# Patient Record
Sex: Male | Born: 1995 | Race: Black or African American | Hispanic: No | Marital: Single | Smoking: Never smoker
Health system: Southern US, Community
[De-identification: ages and names within clinical notes are randomized; demographics above are authoritative.]

## PROBLEM LIST (undated history)

## (undated) DIAGNOSIS — R001 Bradycardia, unspecified: Secondary | ICD-10-CM

## (undated) DIAGNOSIS — T7840XA Allergy, unspecified, initial encounter: Secondary | ICD-10-CM

## (undated) DIAGNOSIS — G43909 Migraine, unspecified, not intractable, without status migrainosus: Secondary | ICD-10-CM

## (undated) HISTORY — DX: Migraine, unspecified, not intractable, without status migrainosus: G43.909

## (undated) HISTORY — DX: Bradycardia, unspecified: R00.1

## (undated) HISTORY — DX: Allergy, unspecified, initial encounter: T78.40XA

---
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
## 2015-06-05 ENCOUNTER — Ambulatory Visit: Payer: Self-pay | Admitting: Family Medicine

## 2015-06-06 ENCOUNTER — Ambulatory Visit: Payer: Medicaid Other | Admitting: Family Medicine

## 2015-12-23 IMAGING — CR RIGHT FOOT COMPLETE - 3+ VIEW
1 series · 3 of 3 positions shown · non-contrast
Comparison: 11/16/2010, ankle

CLINICAL DATA: Pain in the joint, ankle, foot.

EXAM:
RIGHT FOOT COMPLETE - 3+ VIEW

[Series 1: kdxr foot rt complete w/obliques · 0.14mm/px · 3 of 3 slices shown]
[im 1/3]
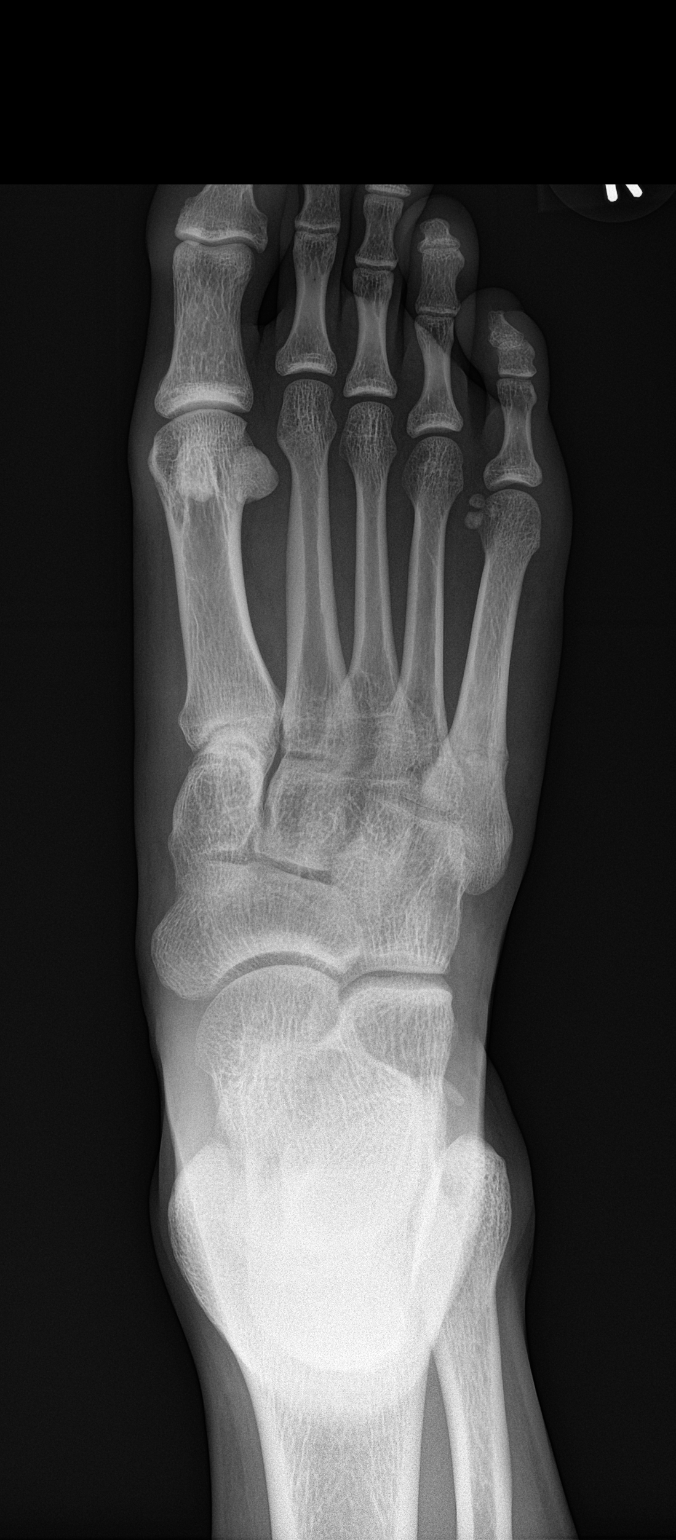
[im 2/3]
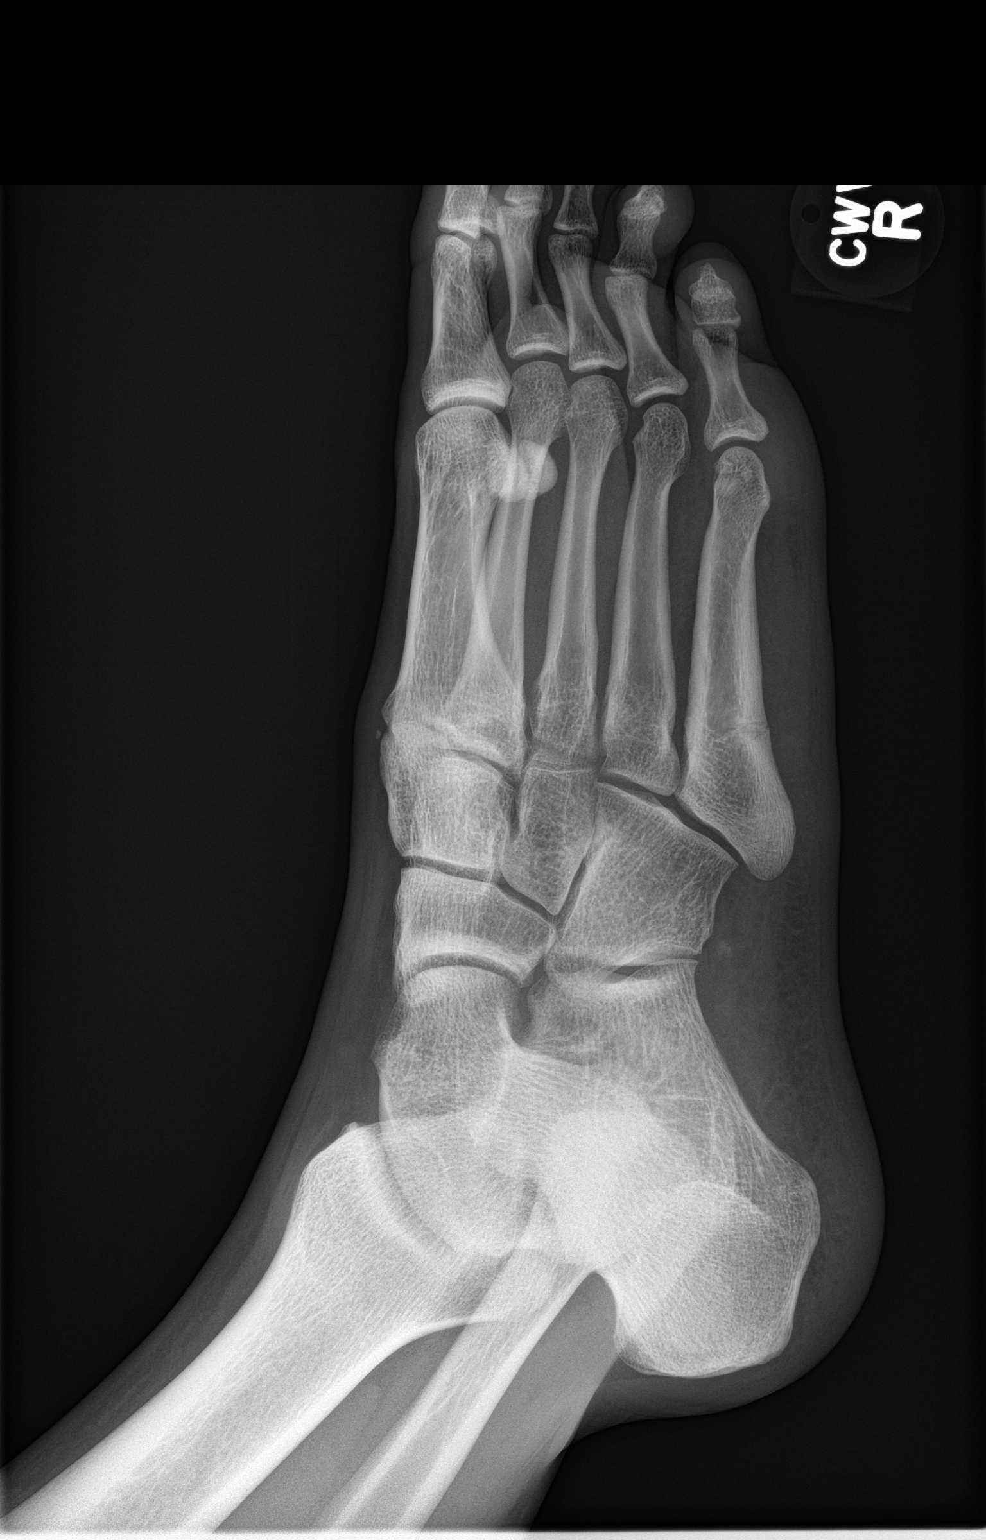
[im 3/3]
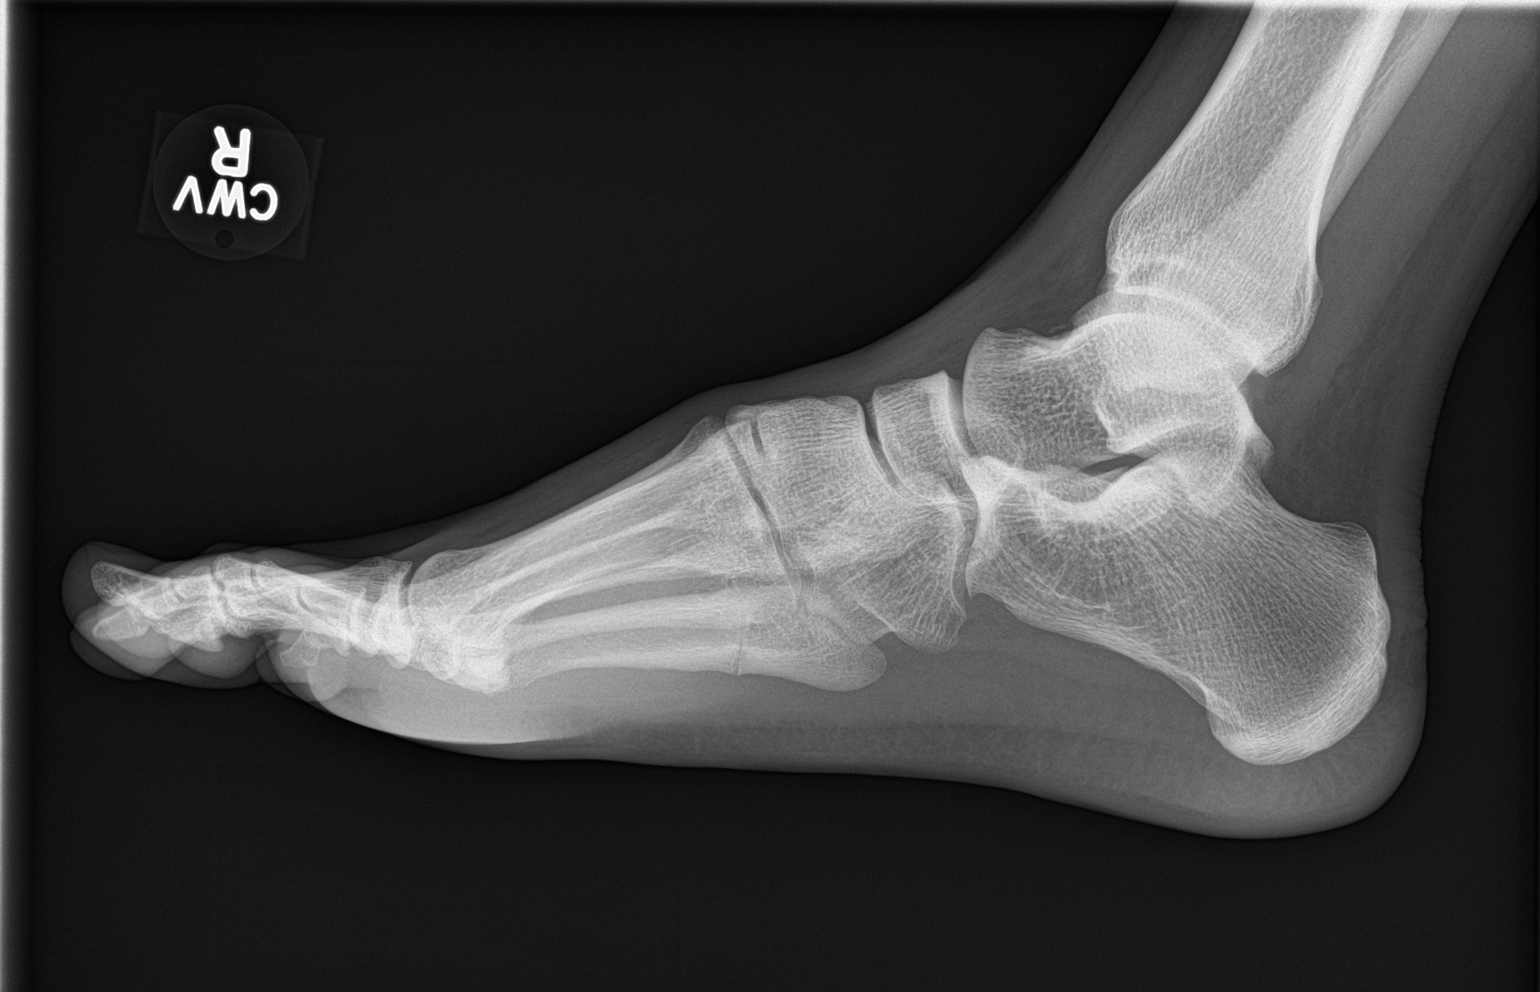

[3 of 3 positions shown; findings below may reference images not displayed]

FINDINGS: There is a transverse fracture at the base of the fifth metatarsal
associated with soft tissue swelling. Normal alignment.
IMPRESSION: Acute fracture of the fifth metatarsal.

## 2016-05-01 ENCOUNTER — Ambulatory Visit: Payer: Self-pay | Admitting: *Deleted

## 2016-05-02 NOTE — BH Specialist Note (Signed)
Counselor met with Cody Rubio today as a walk in.  Medical staff indicated that patient's labs were not good and apparently was not taking his medications accordingly.   It was also reported that patient had obtained a STD which indicated that he was having unprotected sex. Patient was oriented times four with good affect and dress. Patient was alert and honest with counselor. Patient shared that he had unprotected sex with his old boyfriend who also had HIV. Patient communicated that he moved to [facility] and felt that it did a lot good for him emotionally.  Patient stated that he wants to get back on track and would like to get treatment accordingly.  Counselor provided support and encouragement to patient. Counselor recommended that patient make an appointment when he checked out today.  Patient agreed and made the appointment.  [name], MA, LPC [facility]

## 2018-01-16 ENCOUNTER — Encounter: Payer: Self-pay | Admitting: Emergency Medicine

## 2018-01-16 ENCOUNTER — Emergency Department
Admission: EM | Admit: 2018-01-16 | Discharge: 2018-01-16 | Disposition: A | Discharge status: Home / Self Care | Payer: Self-pay | Attending: Emergency Medicine | Admitting: Emergency Medicine

## 2018-01-16 ENCOUNTER — Other Ambulatory Visit: Payer: Self-pay

## 2018-01-16 DIAGNOSIS — K047 Periapical abscess without sinus: Secondary | ICD-10-CM | POA: Insufficient documentation

## 2018-01-16 MED ORDER — AMOXICILLIN 500 MG PO CAPS: 500.0000 mg | ORAL_CAPSULE | Freq: Three times a day (TID) | ORAL | 0 refills | Status: DC

## 2018-01-16 NOTE — ED Provider Notes (Signed)
[facility] Emergency Department Provider Note  ____________________________________________  Time seen: Approximately 5:26 PM  I have reviewed the triage vital signs and the nursing notes.   HISTORY  Chief Complaint Dental Pain    HPI Cody Rubio is a 22 y.o. male that presents emergency department for evaluation of lower left dental pain for 5 days and swelling for 1 day.  His bottom wisdom tooth is coming in.  Patient does not remember his last dental visit.  He denies fever, chills, drainage from mouth, nausea, vomiting, abdominal pain.   Past Medical History:  Diagnosis Date  . Allergy   . Bradycardia   . Migraine     There are no active problems to display for this patient.   Past Surgical History:  Procedure Laterality Date  . CIRCUMCISION  2011    Prior to Admission medications   Medication Sig Start Date End Date Taking? Authorizing Provider  amoxicillin (AMOXIL) 500 MG capsule Take 1 capsule (500 mg total) by mouth 3 (three) times daily. 01/16/18   Enid Derry, PA-C    Allergies Patient has no known allergies.  Family History  Problem Relation Age of Onset  . Heart disease Father   . Asthma Brother     Social History Social History   Tobacco Use  . Smoking status: Never Smoker  Substance Use Topics  . Alcohol use: Not on file  . Drug use: Not on file     Review of Systems  Constitutional: No fever/chills. Cardiovascular: No chest pain. Respiratory: No SOB. Gastrointestinal: No abdominal pain. No nausea, no vomiting.  Musculoskeletal: Negative for musculoskeletal pain. Skin: Negative for rash, abrasions, lacerations, ecchymosis. Neurological: Negative for headaches, numbness or tingling   ____________________________________________   PHYSICAL EXAM:  VITAL SIGNS: ED Triage Vitals  Enc Vitals Group     BP 01/16/18 1517 132/86     Pulse Rate 01/16/18 1517 85     Resp 01/16/18 1517 18     Temp 01/16/18  1517 98.6 F (37 C)     Temp Source 01/16/18 1517 Oral     SpO2 01/16/18 1517 97 %     Weight 01/16/18 1518 170 lb (77.1 kg)     Height 01/16/18 1518 6' (1.829 m)     Head Circumference --      Peak Flow --      Pain Score 01/16/18 1518 3     Pain Loc --      Pain Edu? --      Excl. in GC? --      Constitutional: Alert and oriented. Well appearing and in no acute distress. Eyes: Conjunctivae are normal. PERRL. EOMI. Head: Atraumatic. ENT:      Ears:      Nose: No congestion/rhinnorhea.      Mouth/Throat: Mucous membranes are moist.  Tenderness to palpation around bottom left wisdom tooth.  No visible drainage.  Mild swelling to left cheek.  No TMJ pain.  No drainable abscess. Neck: No stridor.  Cardiovascular: Normal rate, regular rhythm.  Good peripheral circulation. Respiratory: Normal respiratory effort without tachypnea or retractions. Lungs CTAB. Good air entry to the bases with no decreased or absent breath sounds. Musculoskeletal: Full range of motion to all extremities. No gross deformities appreciated. Neurologic:  Normal speech and language. No gross focal neurologic deficits are appreciated.  Skin:  Skin is warm, dry and intact. No rash noted.   ____________________________________________   LABS (all labs ordered are listed, but only abnormal  results are displayed)  Labs Reviewed - No data to display ____________________________________________  EKG   ____________________________________________  RADIOLOGY  No results found.  ____________________________________________    PROCEDURES  Procedure(s) performed:    Procedures    Medications - No data to display   ____________________________________________   INITIAL IMPRESSION / ASSESSMENT AND PLAN / ED COURSE  Pertinent labs & imaging results that were available during my care of the patient were reviewed by me and considered in my medical decision making (see chart for details).  Review  of the [place] CSRS was performed in accordance of the [facility] prior to dispensing any controlled drugs.   Patient's diagnosis is consistent with dental infection.  Vital signs and exam are reassuring.  No drainable abscess.  Patient will be discharged home with prescriptions for amoxicillin.  Dental resources were provided.  Patient is to follow up with dentist as directed. Patient is given ED precautions to return to the ED for any worsening or new symptoms.     ____________________________________________  FINAL CLINICAL IMPRESSION(S) / ED DIAGNOSES  Final diagnoses:  Dental infection      NEW MEDICATIONS STARTED DURING THIS VISIT:  ED Discharge Orders        Ordered    amoxicillin (AMOXIL) 500 MG capsule  3 times daily     01/16/18 1741          This chart was dictated using voice recognition software/Dragon. Despite best efforts to proofread, errors can occur which can change the meaning. Any change was purely unintentional.    Enid DerryWagner, Hersey Maclellan, PA-C 01/16/18 1836    Minna AntisPaduchowski, Kevin, MD 01/17/18 [phone]

## 2018-01-16 NOTE — ED Triage Notes (Signed)
Lower L dental pain x 5 days.  

## 2018-01-16 NOTE — Discharge Instructions (Signed)
OPTIONS FOR DENTAL FOLLOW UP CARE ° °[place] Department of Health and Human Services - Local Safety Net Dental Clinics °http://www.ncdhhs.gov/dph/oralhealth/services/safetynetclinics.htm °  °[facility] (336-562-3123) ° °[facility] ([phone]) ° °[facility] ([phone] ext [phone]) ° °[facility] ([phone]) ° °[facility] ([phone]) °This clinic caters to the indigent population and is on a lottery system. °Location: °[facility], [address], [place] °Clinic Hours: °Wednesdays from 6pm - 9pm, patients seen by a lottery system. °For dates, call or go to [email or web address] °Services: °Cleanings, fillings and simple extractions. °Payment Options: °DENTAL WORK IS FREE OF CHARGE. Bring proof of income or support. °Best way to get seen: °Arrive at 5:15 pm - this is a lottery, NOT first come/first serve, so arriving earlier will not increase your chances of being seen. °  °  °[facility] °[phone] °Select option 1 for emergencies °  °Location: °[facility], [address], [place] °Clinic Hours: °No walk-ins accepted - call the day before to schedule an appointment. °Check in times are 9:30 am and 1:30 pm. °Services: °Simple extractions, temporary fillings, pulpectomy/pulp debridement, uncomplicated abscess drainage. °Payment Options: °PAYMENT IS DUE AT THE TIME OF SERVICE.  Fee is usually $100-200, additional surgical procedures (e.g. abscess drainage) may be extra. °Cash, checks, Visa/MasterCard accepted.  Can file Medicaid if patient is covered for dental - patient should call case worker to check. °No discount for [facility] patients. °Best way to get seen: °MUST call the day before and get onto the schedule. Can usually be seen the next 1-2 days. No walk-ins accepted. °  °  °[facility] °[phone] °   °Location: °[facility], [address], [place] °Clinic Hours: °M, W, Th, F 8am or 1:30pm, Tues 9a or 1:30 - first come/first served. °Services: °Simple extractions, temporary fillings, uncomplicated abscess drainage.  You do not need to be an [place] resident. °Payment Options: °PAYMENT IS DUE AT THE TIME OF SERVICE. °Dental insurance, otherwise sliding scale - bring proof of income or support. °Depending on income and treatment needed, cost is usually $50-200. °Best way to get seen: °Arrive early as it is first come/first served. °  °  °[facility] °[phone] °  °Location: °[address] °Clinic Hours: °Mon-Thu 8a-5p °Services: °Most basic dental services including extractions and fillings. °Payment Options: °PAYMENT IS DUE AT THE TIME OF SERVICE. °Sliding scale, up to 50% off - bring proof if income or support. °Medicaid with dental option accepted. °Best way to get seen: °Call to schedule an appointment, can usually be seen within 2 weeks OR they will try to see walk-ins - show up at 8a or 2p (you may have to wait). °  °  °[facility] °[phone] °[place] RESIDENTS ONLY °  °Location: °[facility], [address], [place], [place] [address] °Clinic Hours: By appointment only. °Monday - Thursday 8am-5pm, Friday 8am-12pm °Services: Cleanings, fillings, extractions. °Payment Options: °PAYMENT IS DUE AT THE TIME OF SERVICE. °Cash, Visa or MasterCard. Sliding scale - $30 minimum per service. °Best way to get seen: °Come in to office, complete packet and make an appointment - need proof of income °or support monies for each household member and proof of [place] residence. °Usually takes about a month to get in. °  °  °[facility] °[phone] °  °Location: °[address]   [place] °Clinic Hours: Walk-in Urgent Care Dental Services are offered Monday-Friday  mornings only. °The numbers of emergencies accepted daily is limited to the number of °providers available. °Maximum 15 - Mondays, Wednesdays & Thursdays °Maximum 10 - Tuesdays & Fridays °Services: °You do not need to be a [place] resident to be seen for a dental emergency. °Emergencies are defined as pain, swelling, abnormal bleeding, or dental trauma. Walkins will receive x-rays if needed. °NOTE: Dental cleaning is not an emergency. °Payment Options: °PAYMENT IS DUE AT THE TIME OF SERVICE. °Minimum co-pay is $40.00 for uninsured patients. °Minimum co-pay is $3.00 for Medicaid with dental coverage. °Dental Insurance is accepted and must be presented at time of visit. °Medicare does not cover dental. °Forms of payment: Cash, credit card, checks. °Best way to get seen: °If not previously registered with the clinic, walk-in dental registration begins at 7:15 am and is on a first come/first serve basis. °If previously registered with the clinic, call to make an appointment. °  °  °[facility] °[phone] °[place] RESIDENTS ONLY °  °Location: °[address], [place], [place] °Clinic Hours: °Mon-Thu 10a-2p °Services: Extractions only! °Payment Options: °FREE (donations accepted) - bring proof of income or support °Best way to get seen: °Call and schedule an appointment OR come at 8am on the 1st Monday of every month (except for holidays) when it is first come/first served. °  °  °[facility] °[phone] °  °Location: °[address], [place] °Clinic Hours: °Friday mornings °Services, Payment Options, Best way to get seen: °Call for info °

## 2018-02-23 ENCOUNTER — Emergency Department
Admission: EM | Admit: 2018-02-23 | Discharge: 2018-02-23 | Disposition: A | Discharge status: Home / Self Care | Payer: Self-pay | Attending: Emergency Medicine | Admitting: Emergency Medicine

## 2018-02-23 ENCOUNTER — Other Ambulatory Visit: Payer: Self-pay

## 2018-02-23 ENCOUNTER — Encounter: Payer: Self-pay | Admitting: Emergency Medicine

## 2018-02-23 DIAGNOSIS — K047 Periapical abscess without sinus: Secondary | ICD-10-CM | POA: Insufficient documentation

## 2018-02-23 MED ORDER — AMOXICILLIN 500 MG PO CAPS: 500.0000 mg | ORAL_CAPSULE | Freq: Three times a day (TID) | ORAL | 0 refills | Status: DC

## 2018-02-23 MED ORDER — HYDROCODONE-ACETAMINOPHEN 5-325 MG PO TABS: 1.0000 | ORAL_TABLET | Freq: Four times a day (QID) | ORAL | 0 refills | Status: DC | PRN

## 2018-02-23 NOTE — ED Triage Notes (Signed)
Presents with left sided facial swelling and increased dental pain   States he has a broken tooth on lower

## 2018-02-23 NOTE — ED Provider Notes (Signed)
[facility] Emergency Department Provider Note ____________________________________________  Time seen: Approximately 8:29 AM  I have reviewed the triage vital signs and the nursing notes.   HISTORY  Chief Complaint Dental Pain   HPI Cody Rubio is a 22 y.o. male who presents to the emergency department for evaluation and treatment of dental pain.  Patient states that he has been taking some antibiotics that were given to him by 1 of his friends, but the pain in the facial swelling has continued.  He states that he has an appointment tomorrow with the dentist.  Past Medical History:  Diagnosis Date  . Allergy   . Bradycardia   . Migraine     There are no active problems to display for this patient.   Past Surgical History:  Procedure Laterality Date  . CIRCUMCISION  2011    Prior to Admission medications   Medication Sig Start Date End Date Taking? Authorizing Provider  amoxicillin (AMOXIL) 500 MG capsule Take 1 capsule (500 mg total) by mouth 3 (three) times daily. 02/23/18   Pammie Chirino, Rulon Eisenmenger B, FNP  HYDROcodone-acetaminophen (NORCO) 5-325 MG tablet Take 1 tablet by mouth every 6 (six) hours as needed for moderate pain. 02/23/18   Chinita Pester, FNP    Allergies Patient has no known allergies.  Family History  Problem Relation Age of Onset  . Heart disease Father   . Asthma Brother     Social History Social History   Tobacco Use  . Smoking status: Never Smoker  . Smokeless tobacco: Never Used  Substance Use Topics  . Alcohol use: Not on file  . Drug use: Not on file    Review of Systems Constitutional: Negative for fever ENT: Positive for dental pain Musculoskeletal: Negative for trismus Skin: Positive for facial swelling overlying area of dental pain ____________________________________________   PHYSICAL EXAM:  VITAL SIGNS: ED Triage Vitals [02/23/18 0825]  Enc Vitals Group     BP 131/70     Pulse Rate (!) 51     Resp  18     Temp 98.6 F (37 C)     Temp Source Oral     SpO2 100 %     Weight      Height      Head Circumference      Peak Flow      Pain Score      Pain Loc      Pain Edu?      Excl. in GC?     Constitutional: Alert and oriented. Well appearing and in no acute distress. Eyes: Conjunctiva are clear without discharge or drainage. Mouth/Throat: See periodontal exam Periodontal Exam    Hematological/Lymphatic/Immunilogical: No palpable lymphadenopathy. Respiratory: Respirations even and unlabored. Musculoskeletal: No trismus Neurologic: Awake, alert, oriented x4. Skin: Facial edema overlying the left mandible that does not extend over the edge into the soft tissue of the neck. No erythema. Psychiatric: Affect and behavior are appropriate.  ____________________________________________   LABS (all labs ordered are listed, but only abnormal results are displayed)  Labs Reviewed - No data to display ____________________________________________   RADIOLOGY  Not indicated ____________________________________________   PROCEDURES  Procedure(s) performed: None  Critical Care performed: No ____________________________________________   INITIAL IMPRESSION / ASSESSMENT AND PLAN / ED COURSE  Cody Rubio is a 22 y.o. male who presents to the emergency department for evaluation and treatment of dental pain.  He was given a prescription for amoxicillin and 1 day of Norco.  He was instructed to see the dentist as scheduled tomorrow.  He was instructed to return to the emergency department for symptoms of change or worsen if he is unable to see the dentist sooner.  Pertinent labs & imaging results that were available during my care of the patient were reviewed by me and considered in my medical decision making (see chart for details).  ____________________________________________   FINAL CLINICAL IMPRESSION(S) / ED DIAGNOSES  Final diagnoses:  Dental abscess    Discharge  Medication List as of 02/23/2018  9:54 AM    START taking these medications   Details  HYDROcodone-acetaminophen (NORCO) 5-325 MG tablet Take 1 tablet by mouth every 6 (six) hours as needed for moderate pain., Starting Tue 02/23/2018, Print        If controlled substance prescribed during this visit, 12 month history viewed on the [place] prior to issuing an initial prescription for Schedule II or III opiod.  Note:  This document was prepared using Dragon voice recognition software and may include unintentional dictation errors.    Chinita Pesterriplett, Averil Digman B, FNP 02/23/18 1002    Jene EveryKinner, Robert, MD 02/23/18 1108

## 2019-10-18 ENCOUNTER — Other Ambulatory Visit: Payer: Self-pay

## 2019-10-18 ENCOUNTER — Emergency Department
Admission: EM | Admit: 2019-10-18 | Discharge: 2019-10-18 | Disposition: A | Discharge status: Home / Self Care | Payer: Self-pay | Attending: Emergency Medicine | Admitting: Emergency Medicine

## 2019-10-18 DIAGNOSIS — K0889 Other specified disorders of teeth and supporting structures: Secondary | ICD-10-CM

## 2019-10-18 DIAGNOSIS — K0381 Cracked tooth: Secondary | ICD-10-CM | POA: Insufficient documentation

## 2019-10-18 MED ORDER — AMOXICILLIN 500 MG PO CAPS: 500.0000 mg | ORAL_CAPSULE | Freq: Three times a day (TID) | ORAL | 0 refills | Status: DC

## 2019-10-18 MED ORDER — IBUPROFEN 800 MG PO TABS: 800.0000 mg | ORAL_TABLET | Freq: Three times a day (TID) | ORAL | 0 refills | Status: AC | PRN

## 2019-10-18 NOTE — ED Notes (Signed)
See triage note  Presents with dental pain  States he thinks it is his wisdom tooth    Low grade temp on arrival

## 2019-10-18 NOTE — ED Triage Notes (Signed)
Right lower toothache X 2 days. Pt alert and oriented X4, cooperative, RR even and unlabored, color WNL. Pt in NAD.

## 2019-10-18 NOTE — Discharge Instructions (Addendum)
OPTIONS FOR DENTAL FOLLOW UP CARE ° °[place] Department of Health and Human Services - Local Safety Net Dental Clinics °http://www.ncdhhs.gov/dph/oralhealth/services/safetynetclinics.htm °  °[facility] ([phone]) ° °[facility] ([phone]) ° °[facility] ([phone] ext [phone]) ° °[facility] ([phone]) ° °[facility] ([phone]) °This clinic caters to the indigent population and is on a lottery system. °Location: °[facility], [address], [place] °Clinic Hours: °Wednesdays from 6pm - 9pm, patients seen by a lottery system. °For dates, call or go to [email or web address] °Services: °Cleanings, fillings and simple extractions. °Payment Options: °DENTAL WORK IS FREE OF CHARGE. Bring proof of income or support. °Best way to get seen: °Arrive at 5:15 pm - this is a lottery, NOT first come/first serve, so arriving earlier will not increase your chances of being seen. °  °  °[facility] °[phone] °Select option 1 for emergencies °  °Location: °[facility], [address], [place] °Clinic Hours: °No walk-ins accepted - call the day before to schedule an appointment. °Check in times are 9:30 am and 1:30 pm. °Services: °Simple extractions, temporary fillings, pulpectomy/pulp debridement, uncomplicated abscess drainage. °Payment Options: °PAYMENT IS DUE AT THE TIME OF SERVICE.  Fee is usually $100-200, additional surgical procedures (e.g. abscess drainage) may be extra. °Cash, checks, Visa/MasterCard accepted.  Can file Medicaid if patient is covered for dental - patient should call case worker to check. °No discount for [facility] patients. °Best way to get seen: °MUST call the day before and get onto the schedule. Can usually be seen the next 1-2 days. No walk-ins accepted. °  °  °[facility] °[phone] °   °Location: °[facility], [address], [place] °Clinic Hours: °M, W, Th, F 8am or 1:30pm, Tues 9a or 1:30 - first come/first served. °Services: °Simple extractions, temporary fillings, uncomplicated abscess drainage.  You do not need to be an [place] resident. °Payment Options: °PAYMENT IS DUE AT THE TIME OF SERVICE. °Dental insurance, otherwise sliding scale - bring proof of income or support. °Depending on income and treatment needed, cost is usually $50-200. °Best way to get seen: °Arrive early as it is first come/first served. °  °  °[facility] °[phone] °  °Location: °[address] °Clinic Hours: °Mon-Thu 8a-5p °Services: °Most basic dental services including extractions and fillings. °Payment Options: °PAYMENT IS DUE AT THE TIME OF SERVICE. °Sliding scale, up to 50% off - bring proof if income or support. °Medicaid with dental option accepted. °Best way to get seen: °Call to schedule an appointment, can usually be seen within 2 weeks OR they will try to see walk-ins - show up at 8a or 2p (you may have to wait). °  °  °[facility] °[phone] °[place] RESIDENTS ONLY °  °Location: °[facility], [address], [place], [place] [address] °Clinic Hours: By appointment only. °Monday - Thursday 8am-5pm, Friday 8am-12pm °Services: Cleanings, fillings, extractions. °Payment Options: °PAYMENT IS DUE AT THE TIME OF SERVICE. °Cash, Visa or MasterCard. Sliding scale - $30 minimum per service. °Best way to get seen: °Come in to office, complete packet and make an appointment - need proof of income °or support monies for each household member and proof of [place] residence. °Usually takes about a month to get in. °  °  °[facility] °[phone] °  °Location: °[address]   [place] °Clinic Hours: Walk-in Urgent Care Dental Services are offered Monday-Friday  mornings only. °The numbers of emergencies accepted daily is limited to the number of °providers available. °Maximum 15 - Mondays, Wednesdays & Thursdays °Maximum 10 - Tuesdays & Fridays °Services: °You do not need to be a [place] resident to be seen for a dental emergency. °Emergencies are defined as pain, swelling, abnormal bleeding, or dental trauma. Walkins will receive x-rays if needed. °NOTE: Dental cleaning is not an emergency. °Payment Options: °PAYMENT IS DUE AT THE TIME OF SERVICE. °Minimum co-pay is $40.00 for uninsured patients. °Minimum co-pay is $3.00 for Medicaid with dental coverage. °Dental Insurance is accepted and must be presented at time of visit. °Medicare does not cover dental. °Forms of payment: Cash, credit card, checks. °Best way to get seen: °If not previously registered with the clinic, walk-in dental registration begins at 7:15 am and is on a first come/first serve basis. °If previously registered with the clinic, call to make an appointment. °  °  °[facility] °[phone] °[place] RESIDENTS ONLY °  °Location: °[address], [place], [place] °Clinic Hours: °Mon-Thu 10a-2p °Services: Extractions only! °Payment Options: °FREE (donations accepted) - bring proof of income or support °Best way to get seen: °Call and schedule an appointment OR come at 8am on the 1st Monday of every month (except for holidays) when it is first come/first served. °  °  °[facility] °[phone] °  °Location: °[address], [place] °Clinic Hours: °Friday mornings °Services, Payment Options, Best way to get seen: °Call for info °

## 2019-10-18 NOTE — ED Provider Notes (Signed)
[facility] Emergency Department Provider Note  ____________________________________________   First MD Initiated Contact with Patient 10/18/19 1648     (approximate)  I have reviewed the triage vital signs and the nursing notes.   HISTORY  Chief Complaint Dental Pain    HPI Cody Rubio is a 23 y.o. male presents emergency department complaining of right-sided dental pain.  States he broke a tooth off several days ago.  No fever or chills.  No swelling.    Past Medical History:  Diagnosis Date  . Allergy   . Bradycardia   . Migraine     There are no active problems to display for this patient.   Past Surgical History:  Procedure Laterality Date  . CIRCUMCISION  2011    Prior to Admission medications   Medication Sig Start Date End Date Taking? Authorizing Provider  amoxicillin (AMOXIL) 500 MG capsule Take 1 capsule (500 mg total) by mouth 3 (three) times daily. 10/18/19   Mayra Brahm, Linden Dolin, PA-C  ibuprofen (ADVIL) 800 MG tablet Take 1 tablet (800 mg total) by mouth every 8 (eight) hours as needed. 10/18/19   Versie Starks, PA-C    Allergies Patient has no known allergies.  Family History  Problem Relation Age of Onset  . Heart disease Father   . Asthma Brother     Social History Social History   Tobacco Use  . Smoking status: Never Smoker  . Smokeless tobacco: Never Used  Substance Use Topics  . Alcohol use: Not on file  . Drug use: Not on file    Review of Systems  Constitutional: No fever/chills Eyes: No visual changes. ENT: No sore throat.  Positive dental pain Respiratory: Denies cough Genitourinary: Negative for dysuria. Musculoskeletal: Negative for back pain. Skin: Negative for rash.    ____________________________________________   PHYSICAL EXAM:  VITAL SIGNS: ED Triage Vitals [10/18/19 1611]  Enc Vitals Group     BP 123/69     Pulse Rate 64     Resp 16     Temp 99.4 F (37.4 C)     Temp Source  Oral     SpO2 98 %     Weight 180 lb (81.6 kg)     Height 6' (1.829 m)     Head Circumference      Peak Flow      Pain Score 4     Pain Loc      Pain Edu?      Excl. in [place]?     Constitutional: Alert and oriented. Well appearing and in no acute distress. Eyes: Conjunctivae are normal.  Head: Atraumatic. Nose: No congestion/rhinnorhea. Mouth/Throat: Mucous membranes are moist.  Tooth is broken at the gumline with parts of the root exposed on the right lower molars Neck:  supple no lymphadenopathy noted Cardiovascular: Normal rate, regular rhythm. Heart sounds are normal Respiratory: Normal respiratory effort.  No retractions, lungs c t a  GU: deferred Musculoskeletal: FROM all extremities, warm and well perfused Neurologic:  Normal speech and language.  Skin:  Skin is warm, dry and intact. No rash noted. Psychiatric: Mood and affect are normal. Speech and behavior are normal.  ____________________________________________   LABS (all labs ordered are listed, but only abnormal results are displayed)  Labs Reviewed - No data to display ____________________________________________   ____________________________________________  RADIOLOGY    ____________________________________________   PROCEDURES  Procedure(s) performed: No  Procedures    ____________________________________________   INITIAL IMPRESSION / ASSESSMENT AND  PLAN / ED COURSE  Pertinent labs & imaging results that were available during my care of the patient were reviewed by me and considered in my medical decision making (see chart for details).   Patient is a 23 year old male presents emergency department complaint dental pain.  Physical exam shows poor dentition.  Broken tooth at the right lower molar with some of the root exposed.  Explained the findings to the patient.  He was given a prescription for amoxicillin ibuprofen.  Follow-up with the dental clinic.  He states he understands will  comply.  Is discharged stable condition.    Cody Rubio was evaluated in Emergency Department on 10/18/2019 for the symptoms described in the history of present illness. He was evaluated in the context of the global COVID-19 pandemic, which necessitated consideration that the patient might be at risk for infection with the SARS-CoV-2 virus that causes COVID-19. Institutional protocols and algorithms that pertain to the evaluation of patients at risk for COVID-19 are in a state of rapid change based on information released by regulatory bodies including the CDC and federal and state organizations. These policies and algorithms were followed during the patient's care in the ED.   As part of my medical decision making, I reviewed the following data within the electronic MEDICAL RECORD NUMBER Nursing notes reviewed and incorporated, Old chart reviewed, Notes from prior ED visits and [place] Controlled Substance Database  ____________________________________________   FINAL CLINICAL IMPRESSION(S) / ED DIAGNOSES  Final diagnoses:  Pain, dental      NEW MEDICATIONS STARTED DURING THIS VISIT:  New Prescriptions   AMOXICILLIN (AMOXIL) 500 MG CAPSULE    Take 1 capsule (500 mg total) by mouth 3 (three) times daily.   IBUPROFEN (ADVIL) 800 MG TABLET    Take 1 tablet (800 mg total) by mouth every 8 (eight) hours as needed.     Note:  This document was prepared using Dragon voice recognition software and may include unintentional dictation errors.    Faythe Ghee, PA-C 10/18/19 1719    Dionne Bucy, MD 10/18/19 1816

## 2020-06-08 ENCOUNTER — Other Ambulatory Visit: Payer: Self-pay

## 2020-06-08 ENCOUNTER — Emergency Department
Admission: EM | Admit: 2020-06-08 | Discharge: 2020-06-08 | Disposition: A | Discharge status: Home / Self Care | Payer: Self-pay | Attending: Emergency Medicine | Admitting: Emergency Medicine

## 2020-06-08 DIAGNOSIS — K047 Periapical abscess without sinus: Secondary | ICD-10-CM

## 2020-06-08 MED ORDER — AMOXICILLIN-POT CLAVULANATE 875-125 MG PO TABS: 1.0000 | ORAL_TABLET | Freq: Two times a day (BID) | ORAL | 0 refills | Status: AC

## 2020-06-08 MED ORDER — IBUPROFEN 800 MG PO TABS
800.0000 mg | ORAL_TABLET | Freq: Once | ORAL | Status: AC
  Administered 2020-06-08: 800 mg via ORAL
  Filled 2020-06-08: qty 1

## 2020-06-08 MED ORDER — LIDOCAINE VISCOUS HCL 2 % MT SOLN
15.0000 mL | OROMUCOSAL | 0 refills | Status: AC | PRN
Start: 2020-06-08 — End: ?

## 2020-06-08 MED ORDER — IBUPROFEN 800 MG PO TABS
800.0000 mg | ORAL_TABLET | Freq: Three times a day (TID) | ORAL | 0 refills | Status: AC | PRN
Start: 2020-06-08 — End: ?

## 2020-06-08 MED ORDER — LIDOCAINE VISCOUS HCL 2 % MT SOLN
15.0000 mL | Freq: Once | OROMUCOSAL | Status: AC
  Administered 2020-06-08: 15 mL via OROMUCOSAL
  Filled 2020-06-08: qty 15

## 2020-06-08 MED ORDER — AMOXICILLIN-POT CLAVULANATE 875-125 MG PO TABS
1.0000 | ORAL_TABLET | Freq: Once | ORAL | Status: AC
  Administered 2020-06-08: 1 via ORAL
  Filled 2020-06-08: qty 1

## 2020-06-08 NOTE — ED Provider Notes (Signed)
[facility] Emergency Department Provider Note  ____________________________________________  Time seen: Approximately 5:13 AM  I have reviewed the triage vital signs and the nursing notes.   HISTORY  Chief Complaint Dental Pain   HPI Cody Rubio is a 24 y.o. male who presents for evaluation of dental pain.  Patient reports 2 days of throbbing constant moderate right lower molar pain.  He try to see a dentist today but his Medicaid was expired.  No fever or chills, no trismus, no drooling, no difficulty breathing or opening his mouth.   Past Medical History:  Diagnosis Date  . Allergy   . Bradycardia   . Migraine      Past Surgical History:  Procedure Laterality Date  . CIRCUMCISION  2011    Prior to Admission medications   Medication Sig Start Date End Date Taking? Authorizing Provider  amoxicillin (AMOXIL) 500 MG capsule Take 1 capsule (500 mg total) by mouth 3 (three) times daily. 10/18/19   Fisher, Linden Dolin, PA-C  amoxicillin-clavulanate (AUGMENTIN) 875-125 MG tablet Take 1 tablet by mouth 2 (two) times daily for 10 days. 06/08/20 06/18/20  Rudene Re, MD  ibuprofen (ADVIL) 800 MG tablet Take 1 tablet (800 mg total) by mouth every 8 (eight) hours as needed. 10/18/19   Fisher, Linden Dolin, PA-C  ibuprofen (ADVIL) 800 MG tablet Take 1 tablet (800 mg total) by mouth every 8 (eight) hours as needed. 06/08/20   Alfred Levins, [place], MD  lidocaine (XYLOCAINE) 2 % solution Use as directed 15 mLs in the mouth or throat as needed for mouth pain. 06/08/20   Rudene Re, MD    Allergies Patient has no known allergies.  Family History  Problem Relation Age of Onset  . Heart disease Father   . Asthma Brother     Social History Social History   Tobacco Use  . Smoking status: Never Smoker  . Smokeless tobacco: Never Used  Substance Use Topics  . Alcohol use: Not on file  . Drug use: Not on file    Review of Systems  Constitutional:  Negative for fever. Eyes: Negative for visual changes. ENT: Negative for sore throat. + tooth pain Neck: No neck pain  Cardiovascular: Negative for chest pain. Respiratory: Negative for shortness of breath. Gastrointestinal: Negative for abdominal pain, vomiting or diarrhea. Genitourinary: Negative for dysuria. Musculoskeletal: Negative for back pain. Skin: Negative for rash. Neurological: Negative for headaches, weakness or numbness. Psych: No SI or HI  ____________________________________________   PHYSICAL EXAM:  VITAL SIGNS: ED Triage Vitals  Enc Vitals Group     BP 06/08/20 0137 129/76     Pulse Rate 06/08/20 0137 81     Resp 06/08/20 0137 18     Temp 06/08/20 0137 99.9 F (37.7 C)     Temp Source 06/08/20 0137 Oral     SpO2 06/08/20 0137 96 %     Weight 06/08/20 0135 170 lb (77.1 kg)     Height 06/08/20 0135 6' (1.829 m)     Head Circumference --      Peak Flow --      Pain Score 06/08/20 0135 10     Pain Loc --      Pain Edu? --      Excl. in [place]? --     Constitutional: Alert and oriented. Well appearing and in no apparent distress. HEENT:      Head: Normocephalic and atraumatic.         Eyes: Conjunctivae are  normal. Sclera is non-icteric.       Mouth/Throat: Mucous membranes are moist.  Right lower molar has pretty large cavities with some swelling and erythema surrounding the tooth but no clear abscess.  No facial swelling, floor of the mouth is soft with no tenderness, induration, or erythema.      Neck: Supple with no signs of meningismus. Cardiovascular: Regular rate and rhythm.  Respiratory: Normal respiratory effort.  Musculoskeletal: No edema, cyanosis, or erythema of extremities. Neurologic: Normal speech and language. Face is symmetric. Moving all extremities. No gross focal neurologic deficits are appreciated. Skin: Skin is warm, dry and intact. No rash noted. Psychiatric: Mood and affect are normal. Speech and behavior are  normal.  ____________________________________________   LABS (all labs ordered are listed, but only abnormal results are displayed)  Labs Reviewed - No data to display ____________________________________________  EKG  none  ____________________________________________  RADIOLOGY  none  ____________________________________________   PROCEDURES  Procedure(s) performed: None Procedures Critical Care performed:  None ____________________________________________   INITIAL IMPRESSION / ASSESSMENT AND PLAN / ED COURSE   24 y.o. male who presents for evaluation of dental pain.  On exam patient has a large cavity with some swelling of the gums around.  Possibly early abscess but no obvious collection of pus that needs to be drained at this time.  No evidence of Ludewig's angina.  Will give Augmentin, viscous lidocaine and ibuprofen.  I explained to the patient that the most important thing is to have the tooth infection addressed by a dentist as soon as possible.  Patient provided with a list of different dental options in the area.  Discussed my return precautions.  Old medical records reviewed.       _____________________________________________ Please note:  Patient was evaluated in Emergency Department today for the symptoms described in the history of present illness. Patient was evaluated in the context of the global COVID-19 pandemic, which necessitated consideration that the patient might be at risk for infection with the SARS-CoV-2 virus that causes COVID-19. Institutional protocols and algorithms that pertain to the evaluation of patients at risk for COVID-19 are in a state of rapid change based on information released by regulatory bodies including the CDC and federal and state organizations. These policies and algorithms were followed during the patient's care in the ED.  Some ED evaluations and interventions may be delayed as a result of limited staffing during the  pandemic.   [place] Controlled Substance Database was reviewed by me. ____________________________________________   FINAL CLINICAL IMPRESSION(S) / ED DIAGNOSES   Final diagnoses:  Dental infection      NEW MEDICATIONS STARTED DURING THIS VISIT:  ED Discharge Orders         Ordered    amoxicillin-clavulanate (AUGMENTIN) 875-125 MG tablet  2 times daily     Discontinue  Reprint     06/08/20 0513    lidocaine (XYLOCAINE) 2 % solution  As needed     Discontinue  Reprint     06/08/20 0513    ibuprofen (ADVIL) 800 MG tablet  Every 8 hours PRN     Discontinue  Reprint     06/08/20 0513           Note:  This document was prepared using Dragon voice recognition software and may include unintentional dictation errors.    Don Perking, [place], MD 06/08/20 [phone]

## 2020-06-08 NOTE — ED Triage Notes (Signed)
Toothache for few days

## 2020-06-08 NOTE — ED Notes (Signed)
Reports for last 2 days he has experienced tooth pain in the lower right side, attempted to go to dentist but states his Medicade expired and was unable to go. Took Tylenol and OTC pain relief mouth rinse with no relief

## 2020-06-08 NOTE — Discharge Instructions (Addendum)
As explained to you, antibiotics will not take care of this infection.  You have to see a dentist as soon as possible to have it removed.  Return to the emergency room for worsening swelling, difficulty opening your mouth, drooling, pain under your tongue or over your neck.    OPTIONS FOR DENTAL FOLLOW UP CARE  [place] [facility] and [facility] [email or web address] [phone])  [facility] [phone])  [place] [phone])  [facility] [phone])  [facility] [phone]) This clinic caters to the indigent population and is on a lottery system. Location: [employer] [employer] [facility], [place], [place], [place] [facility] Hours: Wednesdays from 6pm - 9pm, patients seen by a lottery system. For dates, call or go to [email or web address] Services: Cleanings, fillings and simple extractions. Payment Options: DENTAL WORK IS FREE OF CHARGE. Bring proof of income or support. Best way to get seen: Arrive at 5:15 pm - this is a lottery, NOT first come/first serve, so arriving earlier will not increase your chances of being seen.     [facility] [phone] Select option 1 for emergencies   Location: [facility], [place], [address], [place] Clinic Hours: No walk-ins accepted - call the day before to schedule an appointment. Check in times are 9:30 am and 1:30 pm. Services: Simple extractions, temporary fillings, pulpectomy/pulp debridement, uncomplicated abscess drainage. Payment Options: PAYMENT IS DUE AT THE TIME OF SERVICE.  Fee is usually $100-200, additional surgical procedures (e.g. abscess drainage) may be extra. Cash, checks, Visa/MasterCard accepted.  Can file Medicaid if patient is covered for dental -  patient should call case worker to check. No discount for [facility] patients. Best way to get seen: MUST call the day before and get onto the schedule. Can usually be seen the next 1-2 days. No walk-ins accepted.     [place] [phone]   Location: [place], [place] Clinic Hours: M, W, Th, F 8am or 1:30pm, Tues 9a or 1:30 - first come/first served. Services: Simple extractions, temporary fillings, uncomplicated abscess drainage.  You do not need to be an [facility] resident. Payment Options: PAYMENT IS DUE AT THE TIME OF SERVICE. Dental insurance, otherwise sliding scale - bring proof of income or support. Depending on income and treatment needed, cost is usually $50-200. Best way to get seen: Arrive early as it is first come/first served.     [facility] [phone]   Location: [place] [facility] Hours: Mon-Thu 8a-5p Services: Most basic dental services including extractions and fillings. Payment Options: PAYMENT IS DUE AT THE TIME OF SERVICE. Sliding scale, up to 50% off - bring proof if income or support. Medicaid with dental option accepted. Best way to get seen: Call to schedule an appointment, can usually be seen within 2 weeks OR they will try to see walk-ins - show up at [place] or 2p (you may have to wait).     [facility] [place] RESIDENTS ONLY   Location: [facility], [place] [address] [place], [place] [address] Clinic Hours: By appointment only. Monday - Thursday 8am-5pm, Friday 8am-12pm Services: Cleanings, fillings, extractions. Payment Options: PAYMENT IS DUE AT THE TIME OF SERVICE. Cash, Visa or MasterCard. Sliding scale - $30 minimum per service. Best way to get seen:  Come in to office, complete packet and make an appointment - need proof of income or support monies for each household  member and proof of [facility] residence. Usually takes about a month to get in.     [facility] [phone]   Location: [address] [facility] Hours: Walk-in Urgent Care Dental Services are offered Monday-Friday mornings only. The numbers of emergencies accepted daily is limited to the number of providers available. Maximum 15 - Mondays, Wednesdays & Thursdays Maximum 10 - Tuesdays & Fridays Services: You do not need to be a [facility] resident to be seen for a dental emergency. Emergencies are defined as pain, swelling, abnormal bleeding, or dental trauma. Walkins will receive x-rays if needed. NOTE: Dental cleaning is not an emergency. Payment Options: PAYMENT IS DUE AT THE TIME OF SERVICE. Minimum co-pay is $40.00 for uninsured patients. Minimum co-pay is $3.00 for Medicaid with dental coverage. Dental Insurance is accepted and must be presented at time of visit. Medicare does not cover dental. Forms of payment: Cash, credit card, checks. Best way to get seen: If not previously registered with the clinic, walk-in dental registration begins at 7:15 am and is on a first come/first serve basis. If previously registered with the clinic, call to make an appointment.     [facility] [phone] [place] RESIDENTS ONLY   Location: [address], [place], [place] Clinic Hours: Mon-Thu 10a-2p Services: Extractions only! Payment Options: FREE (donations accepted) - bring proof of income or support Best way to get seen: Call and schedule an appointment OR come at 8am on the 1st Monday of every month (except for holidays) when it is first come/first served.     [facility] [phone]   Location: [address] [place], [place] Clinic Hours: Friday mornings Services, Payment Options, Best way to get seen: Call for info

## 2021-05-28 ENCOUNTER — Emergency Department
Admission: EM | Admit: 2021-05-28 | Discharge: 2021-05-28 | Disposition: A | Discharge status: Home / Self Care | Payer: Medicaid Other | Attending: Emergency Medicine | Admitting: Emergency Medicine

## 2021-05-28 ENCOUNTER — Other Ambulatory Visit: Payer: Self-pay

## 2021-05-28 DIAGNOSIS — H6122 Impacted cerumen, left ear: Secondary | ICD-10-CM | POA: Insufficient documentation

## 2021-05-28 NOTE — Discharge Instructions (Addendum)
Return to the ER for any pain, fevers, drainage.  Avoid using Q-tips.

## 2021-05-28 NOTE — ED Triage Notes (Signed)
Pt presents via POV c/o left ear pain. Reports feels like "left ear is clogged". Reports pain with q tip.

## 2021-05-28 NOTE — ED Notes (Signed)
See triage note. Pt stated ear felt clogged. Pt reports feeling better after provider irrigated ear.

## 2021-05-28 NOTE — ED Provider Notes (Signed)
[facility] EMERGENCY DEPARTMENT Provider Note   CSN: [id number] Arrival date & time: 05/28/21  2003     History Chief Complaint  Patient presents with   Foreign Body in Ear    Cody Rubio is a 25 y.o. male presents to the emergency department for evaluation of cerumen impaction of the left ear.  Patient states he has had some mild decreased hearing in the left ear with muffled sounds.  No pain or drainage.  He states he has been able to see a lot of earwax in his ear with cleaning with Q-tips.  Significant other has seen a lot of wax in the ear.  HPI     Past Medical History:  Diagnosis Date   Allergy    Bradycardia    Migraine     There are no problems to display for this patient.   Past Surgical History:  Procedure Laterality Date   CIRCUMCISION  2011       Family History  Problem Relation Age of Onset   Heart disease Father    Asthma Brother     Social History   Tobacco Use   Smoking status: Never   Smokeless tobacco: Never    Home Medications Prior to Admission medications   Medication Sig Start Date End Date Taking? Authorizing Provider  amoxicillin (AMOXIL) 500 MG capsule Take 1 capsule (500 mg total) by mouth 3 (three) times daily. 10/18/19   Fisher, Roselyn Bering, PA-C  ibuprofen (ADVIL) 800 MG tablet Take 1 tablet (800 mg total) by mouth every 8 (eight) hours as needed. 10/18/19   Fisher, Roselyn Bering, PA-C  ibuprofen (ADVIL) 800 MG tablet Take 1 tablet (800 mg total) by mouth every 8 (eight) hours as needed. 06/08/20   Don Perking, [place], MD  lidocaine (XYLOCAINE) 2 % solution Use as directed 15 mLs in the mouth or throat as needed for mouth pain. 06/08/20   Nita Sickle, MD    Allergies    Patient has no known allergies.  Review of Systems   Review of Systems  Constitutional:  Negative for chills and fever.  HENT:  Negative for congestion, ear pain and facial swelling.   Neurological:  Negative for dizziness,  light-headedness and headaches.   Physical Exam Updated Vital Signs BP (!) 127/55   Pulse 78   Temp 99.2 F (37.3 C) (Oral)   Resp 16   SpO2 100%   Physical Exam Constitutional:      Appearance: He is well-developed.  HENT:     Head: Normocephalic and atraumatic.     Ears:     Comments: Left TM normal after irrigation.  Complete cerumen impaction noted upon initial exam, this was completely evacuated with irrigation.  Canal and TM normal.  No signs of infection or TM damage Eyes:     Conjunctiva/sclera: Conjunctivae normal.  Cardiovascular:     Rate and Rhythm: Normal rate.  Pulmonary:     Effort: Pulmonary effort is normal. No respiratory distress.  Musculoskeletal:        General: Normal range of motion.     Cervical back: Normal range of motion.  Skin:    General: Skin is warm.     Findings: No rash.  Neurological:     Mental Status: He is alert and oriented to person, place, and time.  Psychiatric:        Behavior: Behavior normal.        Thought Content: Thought content normal.    ED  Results / Procedures / Treatments   Labs (all labs ordered are listed, but only abnormal results are displayed) Labs Reviewed - No data to display  EKG None  Radiology No results found.  Procedures Procedures   Medications Ordered in ED Medications - No data to display  ED Course  I have reviewed the triage vital signs and the nursing notes.  Pertinent labs & imaging results that were available during my care of the patient were reviewed by me and considered in my medical decision making (see chart for details).    MDM Rules/Calculators/A&P                          25 year old male with complete left canal cerumen impaction.  This was completely disimpacted with gentle salience irrigation.  Patient tolerated procedure well and no damage to the TM or canal following irrigation.  She is educated on routine ear care Final Clinical Impression(s) / ED Diagnoses Final  diagnoses:  Hearing loss due to cerumen impaction, left    Rx / DC Orders ED Discharge Orders     None        Ronnette Juniper 05/28/21 2150    Phineas Semen, MD 05/28/21 2216

## 2021-09-16 ENCOUNTER — Other Ambulatory Visit: Payer: Self-pay

## 2021-09-16 ENCOUNTER — Emergency Department
Admission: EM | Admit: 2021-09-16 | Discharge: 2021-09-16 | Disposition: A | Discharge status: Home / Self Care | Payer: Medicaid Other | Attending: Emergency Medicine | Admitting: Emergency Medicine

## 2021-09-16 DIAGNOSIS — K2900 Acute gastritis without bleeding: Secondary | ICD-10-CM | POA: Insufficient documentation

## 2021-09-16 MED ORDER — VALACYCLOVIR HCL 500 MG PO TABS
500.0000 mg | ORAL_TABLET | Freq: Three times a day (TID) | ORAL | 0 refills | Status: AC
Start: 2021-09-16 — End: 2021-09-23

## 2021-09-16 MED ORDER — ONDANSETRON 4 MG PO TBDP: 4.0000 mg | ORAL_TABLET | Freq: Three times a day (TID) | ORAL | 0 refills | Status: AC | PRN

## 2021-09-16 NOTE — ED Triage Notes (Signed)
Pt comes with c/o belly pain and some nausea.  MD Kinner speaking with pt at this time.

## 2021-09-16 NOTE — ED Provider Notes (Signed)
[facility] Emergency Department Provider Note   ____________________________________________    I have reviewed the triage vital signs and the nursing notes.   HISTORY  Chief Complaint Nausea     HPI Cody Rubio is a 25 y.o. male with history as noted below who presents with complaints of nausea and abdominal "queasiness ".  He reports this is been ongoing for 24 hours but does seem to be improving.  Denies abdominal pain.  No diarrhea.  Did have some chills last night.  Has not take anything for this.  No sick contacts reported.  No body aches or sore throat  Past Medical History:  Diagnosis Date   Allergy    Bradycardia    Migraine     There are no problems to display for this patient.   Past Surgical History:  Procedure Laterality Date   CIRCUMCISION  2011    Prior to Admission medications   Medication Sig Start Date End Date Taking? Authorizing Provider  ondansetron (ZOFRAN ODT) 4 MG disintegrating tablet Take 1 tablet (4 mg total) by mouth every 8 (eight) hours as needed. 09/16/21  Yes Jene Every, MD  valACYclovir (VALTREX) 500 MG tablet Take 1 tablet (500 mg total) by mouth 3 (three) times daily for 7 days. 09/16/21 09/23/21 Yes Jene Every, MD  amoxicillin (AMOXIL) 500 MG capsule Take 1 capsule (500 mg total) by mouth 3 (three) times daily. 10/18/19   Fisher, Roselyn Bering, PA-C  ibuprofen (ADVIL) 800 MG tablet Take 1 tablet (800 mg total) by mouth every 8 (eight) hours as needed. 10/18/19   Fisher, Roselyn Bering, PA-C  ibuprofen (ADVIL) 800 MG tablet Take 1 tablet (800 mg total) by mouth every 8 (eight) hours as needed. 06/08/20   Don Perking, [place], MD  lidocaine (XYLOCAINE) 2 % solution Use as directed 15 mLs in the mouth or throat as needed for mouth pain. 06/08/20   Nita Sickle, MD     Allergies Patient has no known allergies.  Family History  Problem Relation Age of Onset   Heart disease Father    Asthma Brother     Social  History Social History   Tobacco Use   Smoking status: Never   Smokeless tobacco: Never    Review of Systems  Constitutional: As above Eyes: No visual changes.  ENT: No sore throat. Cardiovascular: Denies chest pain. Respiratory: Denies shortness of breath. Gastrointestinal: As above Genitourinary: Negative for dysuria. Musculoskeletal: Negative for back pain. Skin: Negative for rash. Neurological: Negative for headaches or weakness   ____________________________________________   PHYSICAL EXAM:  VITAL SIGNS: ED Triage Vitals  Enc Vitals Group     BP 09/16/21 1704 125/75     Pulse Rate 09/16/21 1704 77     Resp 09/16/21 1704 18     Temp 09/16/21 1704 98.6 F (37 C)     Temp Source 09/16/21 1704 Oral     SpO2 09/16/21 1704 95 %     Weight --      Height --      Head Circumference --      Peak Flow --      Pain Score 09/16/21 1704 5     Pain Loc --      Pain Edu? --      Excl. in GC? --     Constitutional: Alert and oriented. No acute distress. Pleasant and interactive Eyes: Conjunctivae are normal.   Nose: No congestion/rhinnorhea. Mouth/Throat: Mucous membranes are moist.  Neck:  Painless ROM Cardiovascular: Normal rate, regular rhythm. Good peripheral circulation. Respiratory: Normal respiratory effort.  No retractions.  Gastrointestinal: Soft and nontender. No distention.  Normal exam  Musculoskeletal: No lower extremity tenderness nor edema.  Warm and well perfused Neurologic:  Normal speech and language. No gross focal neurologic deficits are appreciated.  Skin:  Skin is warm, dry and intact. No rash noted. Psychiatric: Mood and affect are normal. Speech and behavior are normal.  ____________________________________________   LABS (all labs ordered are listed, but only abnormal results are displayed)  Labs Reviewed - No data to  display ____________________________________________  EKG  None ____________________________________________  RADIOLOGY  None ____________________________________________   PROCEDURES  Procedure(s) performed: No  Procedures   Critical Care performed: No ____________________________________________   INITIAL IMPRESSION / ASSESSMENT AND PLAN / ED COURSE  Pertinent labs & imaging results that were available during my care of the patient were reviewed by me and considered in my medical decision making (see chart for details).   Patient well-appearing in no acute distress, abdominal exam is quite reassuring, he notes that symptoms are improving, given mild nausea will treat symptomatically.  No chest pain afebrile here normal vitals.  No indication for labs at this time outpatient follow-up, return precautions discussed    ____________________________________________   FINAL CLINICAL IMPRESSION(S) / ED DIAGNOSES  Final diagnoses:  Acute gastritis without hemorrhage, unspecified gastritis type        Note:  This document was prepared using Dragon voice recognition software and may include unintentional dictation errors.    Jene Every, MD 09/16/21 2111

## 2021-09-17 ENCOUNTER — Encounter: Payer: Self-pay | Admitting: Family Medicine

## 2021-09-22 ENCOUNTER — Emergency Department
Admission: EM | Admit: 2021-09-22 | Discharge: 2021-09-22 | Disposition: A | Discharge status: Home / Self Care | Payer: Medicaid Other | Attending: Emergency Medicine | Admitting: Emergency Medicine

## 2021-09-22 ENCOUNTER — Other Ambulatory Visit: Payer: Self-pay

## 2021-09-22 DIAGNOSIS — K0889 Other specified disorders of teeth and supporting structures: Secondary | ICD-10-CM | POA: Insufficient documentation

## 2021-09-22 MED ORDER — AMOXICILLIN 500 MG PO CAPS: 500.0000 mg | ORAL_CAPSULE | Freq: Three times a day (TID) | ORAL | 0 refills | Status: AC

## 2021-09-22 NOTE — ED Provider Notes (Signed)
[facility]-EMERGENCY DEPARTMENT  ____________________________________________  Time seen: Approximately 3:57 PM  I have reviewed the triage vital signs and the nursing notes.   HISTORY  Chief Complaint Dental Pain   Historian Patient     HPI Cody Rubio is a 25 y.o. male presents to the emergency department with right upper dental pain for the past 2 to 3 days.  Patient was told that he should have his wisdom teeth removed but states he never has and he has been occasionally requires antibiotics.  He denies pain at the tongue or difficulty swallowing.  Does not currently have an appointment with a local dentist.   Past Medical History:  Diagnosis Date   Allergy    Bradycardia    Migraine      Immunizations up to date:  Yes.     Past Medical History:  Diagnosis Date   Allergy    Bradycardia    Migraine     There are no problems to display for this patient.   Past Surgical History:  Procedure Laterality Date   CIRCUMCISION  2011    Prior to Admission medications   Medication Sig Start Date End Date Taking? Authorizing Provider  amoxicillin (AMOXIL) 500 MG capsule Take 1 capsule (500 mg total) by mouth 3 (three) times daily for 10 days. 09/22/21 10/02/21  Orvil Feil, PA-C  ibuprofen (ADVIL) 800 MG tablet Take 1 tablet (800 mg total) by mouth every 8 (eight) hours as needed. 10/18/19   Fisher, Roselyn Bering, PA-C  ibuprofen (ADVIL) 800 MG tablet Take 1 tablet (800 mg total) by mouth every 8 (eight) hours as needed. 06/08/20   Don Perking, [place], MD  lidocaine (XYLOCAINE) 2 % solution Use as directed 15 mLs in the mouth or throat as needed for mouth pain. 06/08/20   Nita Sickle, MD  ondansetron (ZOFRAN ODT) 4 MG disintegrating tablet Take 1 tablet (4 mg total) by mouth every 8 (eight) hours as needed. 09/16/21   Jene Every, MD  valACYclovir (VALTREX) 500 MG tablet Take 1 tablet (500 mg total) by mouth 3 (three) times daily for 7 days. 09/16/21 09/23/21  Jene Every, MD    Allergies Patient has no known allergies.  Family History  Problem Relation Age of Onset   Heart disease Father    Asthma Brother     Social History Social History   Tobacco Use   Smoking status: Never   Smokeless tobacco: Never     Review of Systems  Constitutional: No fever/chills Eyes:  No discharge ENT: No upper respiratory complaints. Respiratory: no cough. No SOB/ use of accessory muscles to breath Gastrointestinal:   No nausea, no vomiting.  No diarrhea.  No constipation. Musculoskeletal: Negative for musculoskeletal pain. Skin: Negative for rash, abrasions, lacerations, ecchymosis.    ____________________________________________   PHYSICAL EXAM:  VITAL SIGNS: ED Triage Vitals [09/22/21 1431]  Enc Vitals Group     BP 127/87     Pulse Rate 77     Resp 20     Temp 98.6 F (37 C)     Temp Source Oral     SpO2 96 %     Weight 170 lb (77.1 kg)     Height 6' (1.829 m)     Head Circumference      Peak Flow      Pain Score 6     Pain Loc      Pain Edu?      Excl. in GC?  Constitutional: Alert and oriented. Well appearing and in no acute distress. Eyes: Conjunctivae are normal. PERRL. EOMI. Head: Atraumatic. ENT:      Nose: No congestion/rhinnorhea.      Mouth/Throat: Mucous membranes are moist.  No significant swelling of right upper jaw. Neck: No stridor.  No cervical spine tenderness to palpation. Cardiovascular: Normal rate, regular rhythm. Normal S1 and S2.  Good peripheral circulation. Respiratory: Normal respiratory effort without tachypnea or retractions. Lungs CTAB. Good air entry to the bases with no decreased or absent breath sounds Gastrointestinal: Bowel sounds x 4 quadrants. Soft and nontender to palpation. No guarding or rigidity. No distention. Musculoskeletal: Full range of motion to all extremities. No obvious deformities noted Neurologic:  Normal for age. No gross focal neurologic deficits are appreciated.  Skin:   Skin is warm, dry and intact. No rash noted. Psychiatric: Mood and affect are normal for age. Speech and behavior are normal.   ____________________________________________   LABS (all labs ordered are listed, but only abnormal results are displayed)  Labs Reviewed - No data to display ____________________________________________  EKG   ____________________________________________  RADIOLOGY   No results found.  ____________________________________________    PROCEDURES  Procedure(s) performed:     Procedures     Medications - No data to display   ____________________________________________   INITIAL IMPRESSION / ASSESSMENT AND PLAN / ED COURSE  Pertinent labs & imaging results that were available during my care of the patient were reviewed by me and considered in my medical decision making (see chart for details).      Assessment and plan Dental pain 25 year old male presents to the emergency department with right upper dental pain for the past 2 days.  Patient was started on amoxicillin and a list of dental resources were provided in his discharge paperwork.  Return precautions were given to return with new or worsening symptoms.     ____________________________________________  FINAL CLINICAL IMPRESSION(S) / ED DIAGNOSES  Final diagnoses:  Pain, dental      NEW MEDICATIONS STARTED DURING THIS VISIT:  ED Discharge Orders          Ordered    amoxicillin (AMOXIL) 500 MG capsule  3 times daily        09/22/21 1456                This chart was dictated using voice recognition software/Dragon. Despite best efforts to proofread, errors can occur which can change the meaning. Any change was purely unintentional.     Orvil Feil, PA-C 09/22/21 1559    Gilles Chiquito, MD 09/22/21 [phone]

## 2021-09-22 NOTE — Discharge Instructions (Addendum)
Take Amoxicillin three times daily for the next ten days.   OPTIONS FOR DENTAL FOLLOW UP CARE  [place] [facility] Health and Human Services - Local Safety Net Dental Clinics [email or web address]   [facility] [phone])  [facility] [phone])  [facility] [phone] ext [phone])  [facility] [phone])  [facility] [phone]) This clinic caters to the indigent population and is on a lottery system. Location: [employer] of Dentistry, [employer], [address] [place] Clinic Hours: Wednesdays from 6pm - 9pm, patients seen by a lottery system. For dates, call or go to [email or web address] Services: Cleanings, fillings and simple extractions. Payment Options: DENTAL WORK IS FREE OF CHARGE. Bring proof of income or support. Best way to get seen: Arrive at 5:15 pm - this is a lottery, NOT first come/first serve, so arriving earlier will not increase your chances of being seen.     [facility] [phone] Select option 1 for emergencies   Location: [facility] of Dentistry, [place], [address], [place] Clinic Hours: No walk-ins accepted - call the day before to schedule an appointment. Check in times are 9:30 am and 1:30 pm. Services: Simple extractions, temporary fillings, pulpectomy/pulp debridement, uncomplicated abscess drainage. Payment Options: PAYMENT IS DUE AT THE TIME OF SERVICE.  Fee is usually $100-200, additional surgical procedures (e.g. abscess drainage) may be extra. Cash, checks, Visa/MasterCard accepted.  Can file Medicaid if patient is covered for dental - patient should call case worker to check. No discount for [facility] patients. Best way to get seen: MUST call the day before and get onto the schedule. Can usually be seen the next 1-2 days. No walk-ins  accepted.     [facility] [phone]   Location: [facility], [address], [place] Clinic Hours: M, W, Th, F 8am or 1:30pm, Tues 9a or 1:30 - first come/first served. Services: Simple extractions, temporary fillings, uncomplicated abscess drainage.  You do not need to be an [facility] resident. Payment Options: PAYMENT IS DUE AT THE TIME OF SERVICE. Dental insurance, otherwise sliding scale - bring proof of income or support. Depending on income and treatment needed, cost is usually $50-200. Best way to get seen: Arrive early as it is first come/first served.     [facility] [phone]   Location: [address] Clinic Hours: Mon-Thu 8a-5p Services: Most basic dental services including extractions and fillings. Payment Options: PAYMENT IS DUE AT THE TIME OF SERVICE. Sliding scale, up to 50% off - bring proof if income or support. Medicaid with dental option accepted. Best way to get seen: Call to schedule an appointment, can usually be seen within 2 weeks OR they will try to see walk-ins - show up at 8a or 2p (you may have to wait).     [facility] [phone] [place] RESIDENTS ONLY   Location: [facility], [address] [place], [place] [address] Clinic Hours: By appointment only. Monday - Thursday 8am-5pm, Friday 8am-12pm Services: Cleanings, fillings, extractions. Payment Options: PAYMENT IS DUE AT THE TIME OF SERVICE. Cash, Visa or MasterCard. Sliding scale - $30 minimum per service. Best way to get seen: Come in to office, complete packet and make an appointment - need proof of income or support monies for each household member and proof of [facility] residence. Usually takes about a month to get in.  [facility] [phone]   Location: [address], [place] Clinic Hours: Walk-in  Urgent Care Dental Services are offered Monday-Friday mornings only. The numbers of emergencies accepted daily is limited to the number of providers available. Maximum 15 - Mondays, Wednesdays & Thursdays Maximum 10 - Tuesdays & Fridays Services: You do not need to be a [facility] resident to be seen for a dental emergency. Emergencies are defined as pain, swelling, abnormal bleeding, or dental trauma. Walkins will receive x-rays if needed. NOTE: Dental cleaning is not an emergency. Payment Options: PAYMENT IS DUE AT THE TIME OF SERVICE. Minimum co-pay is $40.00 for uninsured patients. Minimum co-pay is $3.00 for Medicaid with dental coverage. Dental Insurance is accepted and must be presented at time of visit. Medicare does not cover dental. Forms of payment: Cash, credit card, checks. Best way to get seen: If not previously registered with the clinic, walk-in dental registration begins at 7:15 am and is on a first come/first serve basis. If previously registered with the clinic, call to make an appointment.     [facility] [place] ONLY   Location: [address] [place], [place] Clinic Hours: Mon-Thu 10a-2p Services: Extractions only! Payment Options: FREE (donations accepted) - bring proof of income or support Best way to get seen: Call and schedule an appointment OR come at 8am on the 1st Monday of every month (except for holidays) when it is first come/first served.     [facility] [phone]   Location: [place], [place] Clinic Hours: Friday mornings Services, Payment Options, Best way to get seen: Call for info

## 2021-09-22 NOTE — ED Triage Notes (Signed)
Pt to ER via POV with complaints of right upper dental pain. Reports he was previously diagnosed with a dental abscess on his wisdom tooth, was unable to have the wisdom tooth removed, and the pain started again last night.

## 2022-01-22 ENCOUNTER — Emergency Department
Admission: EM | Admit: 2022-01-22 | Discharge: 2022-01-22 | Disposition: A | Discharge status: Home / Self Care | Payer: Medicaid Other | Attending: Emergency Medicine | Admitting: Emergency Medicine

## 2022-01-22 ENCOUNTER — Other Ambulatory Visit: Payer: Self-pay

## 2022-01-22 ENCOUNTER — Encounter: Payer: Self-pay | Admitting: Emergency Medicine

## 2022-01-22 DIAGNOSIS — K029 Dental caries, unspecified: Secondary | ICD-10-CM | POA: Insufficient documentation

## 2022-01-22 DIAGNOSIS — K0889 Other specified disorders of teeth and supporting structures: Secondary | ICD-10-CM

## 2022-01-22 MED ORDER — AMOXICILLIN 500 MG PO TABS: 500.0000 mg | ORAL_TABLET | Freq: Two times a day (BID) | ORAL | 0 refills | Status: AC

## 2022-01-22 MED ORDER — AMOXICILLIN 500 MG PO CAPS
500.0000 mg | ORAL_CAPSULE | Freq: Once | ORAL | Status: AC
  Administered 2022-01-22: 500 mg via ORAL
  Filled 2022-01-22: qty 1

## 2022-01-22 NOTE — Discharge Instructions (Addendum)
You may alternate Tylenol 1000 mg every 6 hours as needed for pain, fever and Ibuprofen 800 mg every 6-8 hours as needed for pain, fever.  Please take Ibuprofen with food.  Do not take more than 4000 mg of Tylenol (acetaminophen) in a 24 hour period.   You may use over-the-counter Orajel as needed for pain control as well.  I recommend that you establish care with a dentist to help with your oral health.

## 2022-01-22 NOTE — ED Provider Notes (Signed)
[facility] Provider Note    Event Date/Time   First MD Initiated Contact with Patient 01/22/22 0132     (approximate)   History   Dental Pain   HPI  Cody Rubio is a 26 y.o. male with a history of recurrent dental pain, dental caries who presents to the emergency department with 2 to 3 days of pain and swelling to the upper gums.  States pain with eating but no pain currently.  No fevers, facial swelling, difficulty swallowing, speaking or breathing.  He does not have a dentist.  He presents frequently to the emergency department with similar complaints reports symptoms improve with antibiotics.   History provided by patient.    Past Medical History:  Diagnosis Date   Allergy    Bradycardia    Migraine     Past Surgical History:  Procedure Laterality Date   CIRCUMCISION  2011    MEDICATIONS:  Prior to Admission medications   Medication Sig Start Date End Date Taking? Authorizing Provider  amoxicillin (AMOXIL) 500 MG tablet Take 1 tablet (500 mg total) by mouth 2 (two) times daily for 10 days. 01/22/22 02/01/22 Yes Armstead Heiland N, DO  ibuprofen (ADVIL) 800 MG tablet Take 1 tablet (800 mg total) by mouth every 8 (eight) hours as needed. 10/18/19   Fisher, Linden Dolin, PA-C  ibuprofen (ADVIL) 800 MG tablet Take 1 tablet (800 mg total) by mouth every 8 (eight) hours as needed. 06/08/20   Alfred Levins, [place], MD  lidocaine (XYLOCAINE) 2 % solution Use as directed 15 mLs in the mouth or throat as needed for mouth pain. 06/08/20   Rudene Re, MD  ondansetron (ZOFRAN ODT) 4 MG disintegrating tablet Take 1 tablet (4 mg total) by mouth every 8 (eight) hours as needed. 09/16/21   Lavonia Drafts, MD    Physical Exam   Triage Vital Signs: ED Triage Vitals [01/22/22 0132]  Enc Vitals Group     BP 108/60     Pulse Rate 69     Resp 18     Temp 98.2 F (36.8 C)     Temp Source Oral     SpO2 100 %     Weight 175 lb (79.4 kg)     Height 6' (1.829 m)      Head Circumference      Peak Flow      Pain Score 5     Pain Loc      Pain Edu?      Excl. in [place]?     Most recent vital signs: Vitals:   01/22/22 0132  BP: 108/60  Pulse: 69  Resp: 18  Temp: 98.2 F (36.8 C)  SpO2: 100%    CONSTITUTIONAL: Alert and oriented and responds appropriately to questions. Well-appearing; well-nourished HEAD: Normocephalic, atraumatic EYES: Conjunctivae clear, pupils appear equal, sclera nonicteric ENT: normal nose; moist mucous membranes; Dental caries noted, no drainable dental abscess noted, gums appear slightly inflamed without bleeding, drainage, no appreciable dental pain on palpation, no Ludwig's angina, tongue sits flat in the bottom of the mouth, no angioedema, no facial erythema or warmth, no facial swelling, moves neck without difficulty, normal phonation without stridor, trismus or drooling. NECK: Supple, normal ROM, no cervical lymphadenopathy CARD: RRR; S1 and S2 appreciated; no murmurs, no clicks, no rubs, no gallops RESP: Normal chest excursion without splinting or tachypnea; breath sounds clear and equal bilaterally; no wheezes, no rhonchi, no rales, no hypoxia or respiratory distress, speaking full sentences  ABD/GI: Normal bowel sounds; non-distended; soft, non-tender, no rebound, no guarding, no peritoneal signs BACK: The back appears normal EXT: Normal ROM in all joints; no deformity noted, no edema; no cyanosis SKIN: Normal color for age and race; warm; no rash on exposed skin NEURO: Moves all extremities equally, normal speech PSYCH: The patient's mood and manner are appropriate.   ED Results / Procedures / Treatments   LABS: (all labs ordered are listed, but only abnormal results are displayed) Labs Reviewed - No data to display   EKG:  RADIOLOGY: My personal review and interpretation of imaging:    I have personally reviewed all radiology reports.   No results found.   PROCEDURES:  Critical Care performed:  No     Procedures    IMPRESSION / MDM / ASSESSMENT AND PLAN / ED COURSE  I reviewed the triage vital signs and the nursing notes.    Patient here with dental pain with frequent visits to the emergency department for the same.    DIFFERENTIAL DIAGNOSIS (includes but not limited to):   Dental pain due to dental caries, periodontal disease.  No sign of dental abscess, Ludwig's angina, facial cellulitis.   PLAN: We will start him on amoxicillin.  Recommended over-the-counter Tylenol, Motrin as needed for pain control.  Have offered him pain medication here which he declines.  Recommended over-the-counter Orajel.  Will give dental follow-up information.   MEDICATIONS GIVEN IN ED: Medications  amoxicillin (AMOXIL) capsule 500 mg (has no administration in time range)        CONSULTS: No emergent dental consult needed.   OUTSIDE RECORDS REVIEWED: No pertinent records for review.         FINAL CLINICAL IMPRESSION(S) / ED DIAGNOSES   Final diagnoses:  Pain, dental     Rx / DC Orders   ED Discharge Orders          Ordered    amoxicillin (AMOXIL) 500 MG tablet  2 times daily        01/22/22 0136             Note:  This document was prepared using Dragon voice recognition software and may include unintentional dictation errors.   Jullia Mulligan, Delice Bison, DO 01/22/22 0140

## 2022-01-22 NOTE — ED Triage Notes (Signed)
Patient ambulatory to triage with steady gait, without difficulty or distress noted; pt reports swelling to upper front gumline x 2-3 days

## 2022-08-04 ENCOUNTER — Emergency Department
Admission: EM | Admit: 2022-08-04 | Discharge: 2022-08-04 | Disposition: A | Discharge status: Home / Self Care | Payer: Medicaid Other | Attending: Emergency Medicine | Admitting: Emergency Medicine

## 2022-08-04 ENCOUNTER — Encounter: Payer: Self-pay | Admitting: Emergency Medicine

## 2022-08-04 DIAGNOSIS — K0889 Other specified disorders of teeth and supporting structures: Secondary | ICD-10-CM | POA: Insufficient documentation

## 2022-08-04 MED ORDER — KETOROLAC TROMETHAMINE 30 MG/ML IJ SOLN
30.0000 mg | Freq: Once | INTRAMUSCULAR | Status: AC
  Administered 2022-08-04: 30 mg via INTRAMUSCULAR
  Filled 2022-08-04: qty 1

## 2022-08-04 MED ORDER — AMOXICILLIN-POT CLAVULANATE 875-125 MG PO TABS: 1.0000 | ORAL_TABLET | Freq: Two times a day (BID) | ORAL | 0 refills | Status: AC

## 2022-08-04 NOTE — ED Provider Notes (Signed)
[facility] Provider Note  Patient Contact: 11:32 PM (approximate)   History   Dental Pain   HPI  Cody Rubio is a 26 y.o. male presents to the emergency department with dental pain and mild left upper jaw swelling that patient noticed today.  No pain underneath the tongue or difficulty swallowing.  Patient has not made an appointment with a local dentist.  No fever or chills.      Physical Exam   Triage Vital Signs: ED Triage Vitals  Enc Vitals Group     BP 08/04/22 2153 98/70     Pulse Rate 08/04/22 2153 67     Resp 08/04/22 2153 18     Temp 08/04/22 2153 98.5 F (36.9 C)     Temp Source 08/04/22 2153 Oral     SpO2 08/04/22 2153 100 %     Weight 08/04/22 2152 203 lb (92.1 kg)     Height 08/04/22 2152 6' (1.829 m)     Head Circumference --      Peak Flow --      Pain Score 08/04/22 2154 3     Pain Loc --      Pain Edu? --      Excl. in GC? --     Most recent vital signs: Vitals:   08/04/22 2153  BP: 98/70  Pulse: 67  Resp: 18  Temp: 98.5 F (36.9 C)  SpO2: 100%     General: Alert and in no acute distress. Eyes:  PERRL. EOMI. Head: No acute traumatic findings ENT:      Nose: No congestion/rhinnorhea.      Mouth/Throat: Mucous membranes are moist.  Patient has mild left upper jaw swelling with some gingival erythema.  No drainable abscess. Neck: No stridor. No cervical spine tenderness to palpation. Cardiovascular:  Good peripheral perfusion Respiratory: Normal respiratory effort without tachypnea or retractions. Lungs CTAB. Good air entry to the bases with no decreased or absent breath sounds. Gastrointestinal: Bowel sounds 4 quadrants. Soft and nontender to palpation. No guarding or rigidity. No palpable masses. No distention. No CVA tenderness. Musculoskeletal: Full range of motion to all extremities.  Neurologic:  No gross focal neurologic deficits are appreciated.  Skin:   No rash noted    ED Results / Procedures /  Treatments   Labs (all labs ordered are listed, but only abnormal results are displayed) Labs Reviewed - No data to display      PROCEDURES:  Critical Care performed: No  Procedures   MEDICATIONS ORDERED IN ED: Medications  ketorolac (TORADOL) 30 MG/ML injection 30 mg (has no administration in time range)     IMPRESSION / MDM / ASSESSMENT AND PLAN / ED COURSE  I reviewed the triage vital signs and the nursing notes.                              Assessment and plan Dental pain 26 year old male presents to the emergency department with left upper jaw swelling and pain.  Vital signs are reassuring at triage.  On exam, patient was alert, active and nontoxic-appearing.  On exam, patient had some mild left upper jaw swelling and gingival erythema.  Will treat with Augmentin and given injection of Toradol.  Dental resources were provided in patient's discharge paperwork.      FINAL CLINICAL IMPRESSION(S) / ED DIAGNOSES   Final diagnoses:  Pain, dental     Rx / DC  Orders   ED Discharge Orders          Ordered    amoxicillin-clavulanate (AUGMENTIN) 875-125 MG tablet  2 times daily        08/04/22 2332             Note:  This document was prepared using Dragon voice recognition software and may include unintentional dictation errors.   Pia Mau Randlett, Cordelia Poche 08/04/22 2334    Shaune Pollack, MD 08/04/22 [phone]

## 2022-08-04 NOTE — Discharge Instructions (Addendum)
OPTIONS FOR DENTAL FOLLOW UP CARE ° °[place] Department [facility] and [facility] Services - Local Safety Net Dental Clinics °http://www.ncdhhs.gov/dph/oralhealth/services/safetynetclinics.htm °  °[facility] (336-562-3123) ° °[facility] [place] (919-933-9087) ° °[facility] ([phone] ext [phone]) ° °[facility] ([phone]) ° °[facility] ([phone]) °This clinic caters to the indigent population and is on a lottery system. °Location: °[facility], [address], [place] °Clinic Hours: °Wednesdays from 6pm - 9pm, patients seen by a lottery system. °For dates, call or go to www.med.[facility].edu/[facility]/patients/Dental-[facility] °Services: °Cleanings, fillings and simple extractions. °Payment Options: °DENTAL WORK IS FREE OF CHARGE. Bring proof of income or support. °Best way to get seen: °Arrive at 5:15 pm - this is a lottery, NOT first come/first serve, so arriving earlier will not increase your chances of being seen. °  °  °[facility] °[phone] °Select option 1 for emergencies °  °Location: °[facility], [address], [place] °Clinic Hours: °No walk-ins accepted - call the day before to schedule an appointment. °Check in times are 9:30 am and 1:30 pm. °Services: °Simple extractions, temporary fillings, pulpectomy/pulp debridement, uncomplicated abscess drainage. °Payment Options: °PAYMENT IS DUE AT THE TIME OF SERVICE.  Fee is usually $100-200, additional surgical procedures (e.g. abscess drainage) may be extra. °Cash, checks, Visa/MasterCard accepted.  Can file Medicaid if patient is covered for dental - patient should call case worker to check. °No discount for [facility] patients. °Best way to get seen: °MUST call the day before and get onto the schedule. Can usually be seen the next 1-2 days. No walk-ins accepted. °  °  °[facility] °[phone] °   °Location: °[facility], [address], [place] °Clinic Hours: °M, W, Th, F 8am or 1:30pm, Tues 9a or 1:30 - first come/first served. °Services: °Simple extractions, temporary fillings, uncomplicated abscess drainage.  You do not need to be an [place] resident. °Payment Options: °PAYMENT IS DUE AT THE TIME OF SERVICE. °Dental insurance, otherwise sliding scale - bring proof of income or support. °Depending on income and treatment needed, cost is usually $50-200. °Best way to get seen: °Arrive early as it is first come/first served. °  °  °[facility] °[phone] °  °Location: °[address] °Clinic Hours: °Mon-Thu 8a-5p °Services: °Most basic dental services including extractions and fillings. °Payment Options: °PAYMENT IS DUE AT THE TIME OF SERVICE. °Sliding scale, up to 50% off - bring proof if income or support. °Medicaid with dental option accepted. °Best way to get seen: °Call to schedule an appointment, can usually be seen within 2 weeks OR they will try to see walk-ins - show up at 8a or 2p (you may have to wait). °  °  °[facility] °919-245-2435 °[place] RESIDENTS ONLY °  °Location: °[facility], [address], [place],  [address] °Clinic Hours: By appointment only. °Monday - Thursday 8am-5pm, Friday 8am-12pm °Services: Cleanings, fillings, extractions. °Payment Options: °PAYMENT IS DUE AT THE TIME OF SERVICE. °Cash, Visa or MasterCard. Sliding scale - $30 minimum per service. °Best way to get seen: °Come in to office, complete packet and make an appointment - need proof of income °or support monies for each household member and proof of [place] residence. °Usually takes about a month to get in. °  °  °[facility] °[phone] °  °Location: °[address]   [place] °Clinic Hours: Walk-in Urgent Care Dental Services are offered Monday-Friday  mornings only. °The numbers of emergencies accepted daily is limited to the number of °providers available. °Maximum 15 - Mondays, Wednesdays & Thursdays °Maximum 10 - Tuesdays & Fridays °Services: °You do not need to be a [place] resident to be seen for a dental emergency. °Emergencies are defined as pain, swelling, abnormal bleeding, or dental trauma. Walkins will receive x-rays if needed. °NOTE: Dental cleaning is not an emergency. °Payment Options: °PAYMENT IS DUE AT THE TIME OF SERVICE. °Minimum co-pay is $40.00 for uninsured patients. °Minimum co-pay is $3.00 for Medicaid with dental coverage. °Dental Insurance is accepted and must be presented at time of visit. °Medicare does not cover dental. °Forms of payment: Cash, credit card, checks. °Best way to get seen: °If not previously registered with the clinic, walk-in dental registration begins at 7:15 am and is on a first come/first serve basis. °If previously registered with the clinic, call to make an appointment. °  °  °[facility] °[phone] °[place] RESIDENTS ONLY °  °Location: °[address], [place], [place] °Clinic Hours: °Mon-Thu 10a-2p °Services: Extractions only! °Payment Options: °FREE (donations accepted) - bring proof of income or support °Best way to get seen: °Call and schedule an appointment OR come at 8am on the 1st Monday of every month (except for holidays) when it is first come/first served. °  °  °[facility] °[phone] °  °Location: °[address], [place] °Clinic Hours: °Friday mornings °Services, Payment Options, Best way to get seen: °Call for info °

## 2022-08-04 NOTE — ED Notes (Signed)
E-signature pad unavailable - Pt verbalized understanding of D/C information - no additional concerns at this time.  

## 2022-08-04 NOTE — ED Triage Notes (Signed)
Pt presents via POV with complaints of left sided dental pain that started a week ago. No meds taken PTA. Denies bleeding gums, cavities, or foul breath.

## 2023-05-15 ENCOUNTER — Ambulatory Visit
Admission: EM | Admit: 2023-05-15 | Discharge: 2023-05-15 | Disposition: A | Discharge status: Home / Self Care | Payer: BLUE CROSS/BLUE SHIELD | Attending: Emergency Medicine | Admitting: Emergency Medicine

## 2023-05-15 DIAGNOSIS — K0510 Chronic gingivitis, plaque induced: Secondary | ICD-10-CM | POA: Diagnosis not present

## 2023-05-15 DIAGNOSIS — K047 Periapical abscess without sinus: Secondary | ICD-10-CM

## 2023-05-15 MED ORDER — AMOXICILLIN 875 MG PO TABS: 875.0000 mg | ORAL_TABLET | Freq: Two times a day (BID) | ORAL | 0 refills | Status: AC

## 2023-05-15 NOTE — Discharge Instructions (Addendum)
Take the antibiotic as prescribed.    A dental resource guide is attached.  Please call to make an appointment with a dentist as soon as possible.    Go to the emergency department if you have acute worsening symptoms.     

## 2023-05-15 NOTE — ED Provider Notes (Signed)
Renaldo Fiddler    CSN: [id number] Arrival date & time: 05/15/23  1541      History   Chief Complaint Chief Complaint  Patient presents with   Abscess    HPI Cody Rubio is a 27 y.o. male.  Patient presents with 3 to 4-day history of pain and swelling of his right upper gum which is similar to previous episode of dental abscess.  No OTC medication taken today.  He has not seen a dentist.  He denies fever, chills, difficulty swallowing, shortness of breath, or other symptoms.  The history is provided by the patient and medical records.    Past Medical History:  Diagnosis Date   Allergy    Bradycardia    Migraine     There are no problems to display for this patient.   Past Surgical History:  Procedure Laterality Date   CIRCUMCISION  2011       Home Medications    Prior to Admission medications   Medication Sig Start Date End Date Taking? Authorizing Provider  amoxicillin (AMOXIL) 875 MG tablet Take 1 tablet (875 mg total) by mouth 2 (two) times daily for 7 days. 05/15/23 05/22/23 Yes Mickie Bail, NP  ibuprofen (ADVIL) 800 MG tablet Take 1 tablet (800 mg total) by mouth every 8 (eight) hours as needed. 10/18/19   Fisher, Roselyn Bering, PA-C  ibuprofen (ADVIL) 800 MG tablet Take 1 tablet (800 mg total) by mouth every 8 (eight) hours as needed. 06/08/20   Don Perking, [place], MD  lidocaine (XYLOCAINE) 2 % solution Use as directed 15 mLs in the mouth or throat as needed for mouth pain. 06/08/20   Nita Sickle, MD  ondansetron (ZOFRAN ODT) 4 MG disintegrating tablet Take 1 tablet (4 mg total) by mouth every 8 (eight) hours as needed. 09/16/21   Jene Every, MD    Family History Family History  Problem Relation Age of Onset   Heart disease Father    Asthma Brother     Social History Social History   Tobacco Use   Smoking status: Never   Smokeless tobacco: Never  Vaping Use   Vaping Use: Never used  Substance Use Topics   Alcohol use: Yes   Drug use:  Never     Allergies   Patient has no known allergies.   Review of Systems Review of Systems  Constitutional:  Negative for chills and fever.  HENT:  Positive for dental problem. Negative for sore throat, trouble swallowing and voice change.   Respiratory:  Negative for cough and shortness of breath.      Physical Exam Triage Vital Signs ED Triage Vitals  Enc Vitals Group     BP 05/15/23 1559 119/71     Pulse Rate 05/15/23 1559 (!) 59     Resp 05/15/23 1559 18     Temp 05/15/23 1559 (!) 97.5 F (36.4 C)     Temp src --      SpO2 05/15/23 1559 98 %     Weight --      Height --      Head Circumference --      Peak Flow --      Pain Score 05/15/23 1558 2     Pain Loc --      Pain Edu? --      Excl. in GC? --    No data found.  Updated Vital Signs BP 119/71   Pulse (!) 59   Temp (!) 97.5 F (  36.4 C)   Resp 18   SpO2 98%   Visual Acuity Right Eye Distance:   Left Eye Distance:   Bilateral Distance:    Right Eye Near:   Left Eye Near:    Bilateral Near:     Physical Exam Constitutional:      General: He is not in acute distress.    Appearance: Normal appearance. He is not ill-appearing.  HENT:     Mouth/Throat:     Mouth: Mucous membranes are moist.     Pharynx: Oropharynx is clear.     Comments: Right lateral upper gum erythematous and edematous.  No open wounds or drainage.  Cardiovascular:     Rate and Rhythm: Normal rate and regular rhythm.  Pulmonary:     Effort: Pulmonary effort is normal. No respiratory distress.  Skin:    General: Skin is warm and dry.  Neurological:     Mental Status: He is alert.  Psychiatric:        Mood and Affect: Mood normal.        Behavior: Behavior normal.      UC Treatments / Results  Labs (all labs ordered are listed, but only abnormal results are displayed) Labs Reviewed - No data to display  EKG   Radiology No results found.  Procedures Procedures (including critical care time)  Medications  Ordered in UC Medications - No data to display  Initial Impression / Assessment and Plan / UC Course  I have reviewed the triage vital signs and the nursing notes.  Pertinent labs & imaging results that were available during my care of the patient were reviewed by me and considered in my medical decision making (see chart for details).    Gingivitis, dental abscess.  Treating with amoxicillin.  Tylenol or ibuprofen as needed.  Dental resource guide provided and instructed patient to schedule an appointment with a dentist as soon as possible.  Education also provided on gingivitis and dental abscess.  ED precautions given.  He agrees to plan of care.  Final Clinical Impressions(s) / UC Diagnoses   Final diagnoses:  Gingivitis  Dental abscess     Discharge Instructions      Take the antibiotic as prescribed.    A dental resource guide is attached.  Please call to make an appointment with a dentist as soon as possible.    Go to the emergency department if you have acute worsening symptoms.         ED Prescriptions     Medication Sig Dispense Auth. Provider   amoxicillin (AMOXIL) 875 MG tablet Take 1 tablet (875 mg total) by mouth 2 (two) times daily for 7 days. 14 tablet Mickie Bail, NP      I have reviewed the PDMP during this encounter.   Mickie Bail, NP 05/15/23 [phone]

## 2023-05-15 NOTE — ED Triage Notes (Signed)
Patient to Urgent Care with complaints of right upper dental pain, reports possible dental abscess. Symptoms started 3-4 days ago.  Hx of the same.

## 2024-11-01 ENCOUNTER — Emergency Department
Admission: EM | Admit: 2024-11-01 | Discharge: 2024-11-01 | Disposition: A | Discharge status: Home / Self Care | Payer: Self-pay | Attending: Emergency Medicine | Admitting: Emergency Medicine

## 2024-11-01 ENCOUNTER — Other Ambulatory Visit: Payer: Self-pay

## 2024-11-01 DIAGNOSIS — L600 Ingrowing nail: Secondary | ICD-10-CM | POA: Insufficient documentation

## 2024-11-01 MED ORDER — TRIAMCINOLONE ACETONIDE 0.5 % EX OINT: 1.0000 | TOPICAL_OINTMENT | Freq: Two times a day (BID) | CUTANEOUS | 0 refills | Status: AC

## 2024-11-01 NOTE — ED Triage Notes (Signed)
 Pt to ED for sore to left big toe for a couple weeks.

## 2024-11-01 NOTE — Discharge Instructions (Addendum)
 You have an ingrown toenail on your left big toe.  Please apply this steroid cream twice a day.  You will need to soak your foot in warm soapy water twice a day for 10 to 20 minutes and then apply the steroid cream after.  This will be done for 2 weeks.  Please follow-up with the podiatrist as information is attached.  Return to the emergency department with worsening symptoms like increasing pain, swelling or redness spreading up your foot.

## 2024-11-01 NOTE — ED Provider Notes (Signed)
 [facility] Provider Note    Event Date/Time   First MD Initiated Contact with Patient 11/01/24 0815     (approximate)   History   Wound Check   HPI  Cody Rubio is a 28 y.o. male with PMH of migraine presents for evaluation of left toe pain for few weeks.  Patient has been soaking it in warm water as well as applying Neosporin.  No fevers.      Physical Exam   Triage Vital Signs: ED Triage Vitals  Encounter Vitals Group     BP 11/01/24 0807 133/66     Girls Systolic BP Percentile --      Girls Diastolic BP Percentile --      Boys Systolic BP Percentile --      Boys Diastolic BP Percentile --      Pulse Rate 11/01/24 0807 69     Resp 11/01/24 0807 20     Temp 11/01/24 0807 98.1 F (36.7 C)     Temp src --      SpO2 11/01/24 0807 98 %     Weight 11/01/24 0806 240 lb (108.9 kg)     Height 11/01/24 0806 6' (1.829 m)     Head Circumference --      Peak Flow --      Pain Score 11/01/24 0806 1     Pain Loc --      Pain Education --      Exclude from Growth Chart --     Most recent vital signs: Vitals:   11/01/24 0807 11/01/24 0816  BP: 133/66   Pulse: 69   Resp: 20   Temp: 98.1 F (36.7 C)   SpO2: 98% 98%   General: Awake, no distress.  CV:  Good peripheral perfusion.  Resp:  Normal effort.  Abd:  No distention.  Other:  Left great toenail is ingrown with some local erythema and small amount of purulent drainage    ED Results / Procedures / Treatments   Labs (all labs ordered are listed, but only abnormal results are displayed) Labs Reviewed - No data to display    PROCEDURES:  Critical Care performed: No  Procedures   MEDICATIONS ORDERED IN ED: Medications - No data to display   IMPRESSION / MDM / ASSESSMENT AND PLAN / ED COURSE  I reviewed the triage vital signs and the nursing notes.                             28 year old male presents for evaluation of right great toe pain.  Vital signs are stable  patient NAD on exam.  Differential diagnosis includes, but is not limited to, ingrown toenail, paronychia, cellulitis.  Patient's presentation is most consistent with acute, uncomplicated illness.  Patient has an ingrown toenail on the left great toe, there is some local erythema but none traveling up the foot so I did not place patient on oral antibiotics at this time.  Did advise him to please continue to do the foot soaks and will also send steroids for him to use.  Advised him to follow-up with podiatry for ingrown toenail removal.  Discussed return precautions.  Patient voiced understanding, questions were answered and he was stable at discharge.     FINAL CLINICAL IMPRESSION(S) / ED DIAGNOSES   Final diagnoses:  Ingrown toenail of right foot     Rx / DC Orders   ED Discharge  Orders          Ordered    triamcinolone ointment (KENALOG) 0.5 %  2 times daily        11/01/24 0846             Note:  This document was prepared using Dragon voice recognition software and may include unintentional dictation errors.   Cleaster Tinnie LABOR, PA-C 11/01/24 9147    Levander Slate, MD 11/01/24 1447
